# Patient Record
Sex: Female | Born: 1985
Health system: Southern US, Community
[De-identification: ages and names within clinical notes are randomized; demographics above are authoritative.]

## PROBLEM LIST (undated history)

## (undated) DIAGNOSIS — O24419 Gestational diabetes mellitus in pregnancy, unspecified control: Secondary | ICD-10-CM

## (undated) DIAGNOSIS — F419 Anxiety disorder, unspecified: Secondary | ICD-10-CM

## (undated) DIAGNOSIS — D689 Coagulation defect, unspecified: Secondary | ICD-10-CM

## (undated) DIAGNOSIS — R079 Chest pain, unspecified: Secondary | ICD-10-CM

## (undated) DIAGNOSIS — G08 Intracranial and intraspinal phlebitis and thrombophlebitis: Secondary | ICD-10-CM

## (undated) HISTORY — DX: Gestational diabetes mellitus in pregnancy, unspecified control: O24.419

## (undated) HISTORY — PX: NO PAST SURGERIES: SHX2092

## (undated) HISTORY — DX: Coagulation defect, unspecified: D68.9

---

## 2016-06-02 ENCOUNTER — Emergency Department (HOSPITAL_COMMUNITY): Payer: BLUE CROSS/BLUE SHIELD

## 2016-06-02 ENCOUNTER — Encounter (HOSPITAL_COMMUNITY): Payer: Self-pay | Admitting: Emergency Medicine

## 2016-06-02 ENCOUNTER — Emergency Department (HOSPITAL_COMMUNITY)
Admission: EM | Admit: 2016-06-02 | Discharge: 2016-06-02 | Disposition: A | Discharge status: Home / Self Care | Payer: BLUE CROSS/BLUE SHIELD | Attending: Emergency Medicine | Admitting: Emergency Medicine

## 2016-06-02 DIAGNOSIS — Z79899 Other long term (current) drug therapy: Secondary | ICD-10-CM | POA: Diagnosis not present

## 2016-06-02 DIAGNOSIS — R079 Chest pain, unspecified: Secondary | ICD-10-CM | POA: Diagnosis present

## 2016-06-02 DIAGNOSIS — F419 Anxiety disorder, unspecified: Secondary | ICD-10-CM | POA: Diagnosis not present

## 2016-06-02 DIAGNOSIS — Z7982 Long term (current) use of aspirin: Secondary | ICD-10-CM | POA: Insufficient documentation

## 2016-06-02 DIAGNOSIS — K219 Gastro-esophageal reflux disease without esophagitis: Secondary | ICD-10-CM | POA: Diagnosis not present

## 2016-06-02 HISTORY — DX: Chest pain, unspecified: R07.9

## 2016-06-02 HISTORY — DX: Intracranial and intraspinal phlebitis and thrombophlebitis: G08

## 2016-06-02 HISTORY — DX: Anxiety disorder, unspecified: F41.9

## 2016-06-02 LAB — I-STAT CHEM 8, ED
BUN: 16 mg/dL (ref 6–20)
CHLORIDE: 108 mmol/L (ref 101–111)
Calcium, Ion: 1.13 mmol/L — ABNORMAL LOW (ref 1.15–1.40)
Creatinine, Ser: 0.6 mg/dL (ref 0.44–1.00)
Glucose, Bld: 96 mg/dL (ref 65–99)
HCT: 36 % (ref 36.0–46.0)
HEMOGLOBIN: 12.2 g/dL (ref 12.0–15.0)
POTASSIUM: 5.9 mmol/L — AB (ref 3.5–5.1)
SODIUM: 136 mmol/L (ref 135–145)
TCO2: 25 mmol/L (ref 0–100)

## 2016-06-02 LAB — I-STAT BETA HCG BLOOD, ED (MC, WL, AP ONLY)

## 2016-06-02 MED ORDER — HYDROXYZINE HCL 25 MG PO TABS: 25.0000 mg | ORAL_TABLET | Freq: Four times a day (QID) | ORAL | 0 refills | Status: DC | PRN

## 2016-06-02 MED ORDER — OMEPRAZOLE 20 MG PO CPDR: 20.0000 mg | DELAYED_RELEASE_CAPSULE | Freq: Every day | ORAL | 1 refills | Status: DC

## 2016-06-02 MED ORDER — GI COCKTAIL ~~LOC~~
30.0000 mL | Freq: Once | ORAL | Status: AC
  Administered 2016-06-02: 30 mL via ORAL
  Filled 2016-06-02: qty 30

## 2016-06-02 NOTE — ED Provider Notes (Signed)
[facility]-EMERGENCY DEPT Provider Note   CSN: [id number] Arrival date & time: 06/02/16  1204     History   Chief Complaint Chief Complaint  Patient presents with  . Chest Pain    HPI Kimberly Parks is a 31 y.o. female.  HPI Pt was shopping.  She bent over and when she stood up she had sudden onset of burning pain in her center of her chest.  She felt tingling in her body, most on the left side but also on the right.  She felt short of breath.   No pain anywhere else.  No fevers.  No coughing.  No history of heart or lung problems.  No birth control pills.  No history of PE or DVT.  No history of heart disease.  She took a couple of clonazepam.  Over the last couple of hours the sx have slowly improved but not completely resolved.  Pt has had similar sx before.  This is the 4th time.  She was told before it was a panic attack but this one felt different. Past Medical History:  Diagnosis Date  . Anxiety   . Cerebral venous thrombosis   . Chest pain     There are no active problems to display for this patient.   History reviewed. No pertinent surgical history.  OB History    No data available       Home Medications    Prior to Admission medications   Medication Sig Start Date End Date Taking? Authorizing Provider  aspirin EC 81 MG tablet Take 81 mg by mouth daily.   Yes [provider]  cetirizine (ZYRTEC) 10 MG tablet Take 10 mg by mouth daily.   Yes [provider]  clonazePAM (KLONOPIN) 0.5 MG tablet Take 0.5 mg by mouth daily as needed for anxiety. 04/19/16  Yes [provider]  levocetirizine (XYZAL) 5 MG tablet Take 5 mg by mouth every evening.   Yes [provider]  PARAGARD INTRAUTERINE COPPER IU 1 application by Intrauterine route once.   Yes [provider]  hydrOXYzine (ATARAX/VISTARIL) 25 MG tablet Take 1 tablet (25 mg total) by mouth every 6 (six) hours as needed for anxiety. 06/02/16   Linwood Dibbles, MD  omeprazole  (PRILOSEC) 20 MG capsule Take 1 capsule (20 mg total) by mouth daily. 06/02/16   Linwood Dibbles, MD    Family History Family History  Problem Relation Age of Onset  . Hypertension Mother   . Diabetes Father     Social History Social History  Substance Use Topics  . Smoking status: Never Smoker  . Smokeless tobacco: Never Used  . Alcohol use Yes     Comment: occasional     Allergies   Patient has no known allergies.   Review of Systems Review of Systems  All other systems reviewed and are negative.    Physical Exam Updated Vital Signs BP 120/65   Pulse 76   Temp 98 F (36.7 C) (Oral)   Resp 17   Ht 1.575 m (5\' 2" )   Wt 70.3 kg (155 lb)   LMP 05/28/2016 (Exact Date)   SpO2 98%   BMI 28.35 kg/m   Physical Exam  Constitutional: She appears well-developed and well-nourished. No distress.  HENT:  Head: Normocephalic and atraumatic.  Right Ear: External ear normal.  Left Ear: External ear normal.  Eyes: Conjunctivae are normal. Right eye exhibits no discharge. Left eye exhibits no discharge. No scleral icterus.  Neck: Neck supple. No  tracheal deviation present.  Cardiovascular: Normal rate, regular rhythm and intact distal pulses.   Pulmonary/Chest: Effort normal and breath sounds normal. No stridor. No respiratory distress. She has no wheezes. She has no rales.  Abdominal: Soft. Bowel sounds are normal. She exhibits no distension. There is no tenderness. There is no rebound and no guarding.  Musculoskeletal: She exhibits no edema or tenderness.  Neurological: She is alert. She has normal strength. No cranial nerve deficit (no facial droop, extraocular movements intact, no slurred speech) or sensory deficit. She exhibits normal muscle tone. She displays no seizure activity. Coordination normal.  Skin: Skin is warm and dry. No rash noted.  Psychiatric: She has a normal mood and affect.  Nursing note and vitals reviewed.    ED Treatments / Results  Labs (all labs  ordered are listed, but only abnormal results are displayed) Labs Reviewed  I-STAT CHEM 8, ED - Abnormal; Notable for the following:       Result Value   Potassium 5.9 (*)    Calcium, Ion 1.13 (*)    All other components within normal limits  I-STAT BETA HCG BLOOD, ED ([facility], [facility], [facility] ONLY)    EKG  EKG Interpretation  Date/Time:  Sunday Jun 02 2016 12:59:47 EDT Ventricular Rate:  78 PR Interval:    QRS Duration: 94 QT Interval:  379 QTC Calculation: 432 R Axis:   48 Text Interpretation:  Sinus rhythm Low voltage, precordial leads Baseline wander in lead(s) V4 No old tracing to compare Confirmed by Linwood DibblesKnapp, Eirik Schueler [phone]([id number]) on 06/02/2016 3:09:03 PM       Radiology Dg Chest 2 View  Result Date: 06/02/2016 CLINICAL DATA:  Chest pain. EXAM: CHEST  2 VIEW COMPARISON:  None. FINDINGS: Cardiomediastinal silhouette is normal in size and configuration. Lungs are clear. Lung volumes are normal. No evidence of pneumonia. No pleural effusion. No pneumothorax. Osseous and soft tissue structures about the chest are unremarkable. IMPRESSION: Normal chest x-ray. Electronically Signed   By: Bary RichardStan  Maynard M.D.   On: 06/02/2016 15:25    Procedures Procedures (including critical care time)  Medications Ordered in ED Medications  gi cocktail (Maalox,Lidocaine,Donnatal) (30 mLs Oral Given 06/02/16 1544)     Initial Impression / Assessment and Plan / ED Course  I have reviewed the triage vital signs and the nursing notes.  Pertinent labs & imaging results that were available during my care of the patient were reviewed by me and considered in my medical decision making (see chart for details).  Clinical Course as of May 28 0001  Sun Jun 02, 2016  [facility]  [JK]    Clinical Course User Index [JK] Linwood DibblesKnapp, Cassandre Oleksy, MD    Patient presented to the emergency room with complaints of chest pain and weakness. I suspect that her symptoms today were related to gastroesophageal reflux. Think that may have  triggered an anxiety attack. I doubt the patient has a pulmonary embolism. She is perk negative. An overall low risk for pulmonary embolism. I doubt that the symptoms were related to acute coronary syndrome. Plan on discharge home with antacids and discussed outpatient follow up regarding her anxiety issues Final Clinical Impressions(s) / ED Diagnoses   Final diagnoses:  Gastroesophageal reflux disease, esophagitis presence not specified  Chest pain, unspecified type  Anxiety    New Prescriptions Discharge Medication List as of 06/02/2016  5:13 PM    START taking these medications   Details  hydrOXYzine (ATARAX/VISTARIL) 25 MG tablet Take 1 tablet (25 mg  total) by mouth every 6 (six) hours as needed for anxiety., Starting Sun 06/02/2016, Print    omeprazole (PRILOSEC) 20 MG capsule Take 1 capsule (20 mg total) by mouth daily., Starting Sun 06/02/2016, Print         Linwood Dibbles, MD 06/03/16 Marlyne Beards

## 2016-06-02 NOTE — ED Triage Notes (Signed)
Pt c/o chest pain and burning to mid chest at 10:30 this morning. Pt states she was bending over and developed pain, pt states she had episode of feeling light headed. States she has had intermittent symptoms similar to this r/t anxiety, on meds for anxiety. Pt in no distress in triage.

## 2016-06-02 NOTE — Discharge Instructions (Signed)
Follow-up with a primary care doctor to discuss further treatment, you can take the hydroxyzine as needed for any anxiety attack, try taking the Prilosec to see if that helps with the symptoms   Outpatient Psychiatry and Counseling  [facility]: Mobile Crisis Management 24 hours:  [phone]  [facility] [employer] sliding scale fee and walk in schedule: M-F 8am-12pm/1pm-3pm [address]  [place], [place] [address] [phone]  [facility] [address] [place], [place] [address] [phone]  [facility] [employer] new patient walk-in appointments available Monday - Friday 8am -3pm.          [address] [place], [place] [address] [phone] or crisis line- [phone]  [facility] Outpatient Services/ Intensive Outpatient Therapy Program [address] [place], [place] [address] [phone]  [facility] Mental Health                  Crisis Services      [phone] [address]     [place], [place] [address]                 [facility]   [facility] [phone]. [address] [place], [place] [address]   [employer] of Care          [address]  [place], [place] [address]       [phone]  [facility] [address] [place], [place] [address] [phone]  [facility]    [address]    [place], [place] [address]     [phone]       Andee Poles, MD     [address]     [place] [address]     [phone]       [facility] [address] [place] [address]  [facility]     [address] [place], [place]      [phone]       [facility] Eulogio Ditch, MD [address] [place], [place] [address] [phone]  [facility]     [address]     [place], [place] [address]     [phone]       [facility] [address] [place], [place] [address] [phone] Resources for Temporary Residential Assistance/Crisis Centers  DAY CENT[facility]) M-F 8am-3pm   [address] [place], [place] [address]   [phone] Services include: laundry, barbering, support groups, case management, phone  & computer access, showers, AA/NA mtgs, mental health/substance abuse nurse, job skills class, disability information, VA assistance, spiritual classes, etc.   HOMELESS SHELTERS  [facility]     [facility]   [address] [place]     [phone]              [employer] (women and children)       [address] [place], [place] [address] [phone] [email or web address] for application and process Application Required  [facility]   [address]    [place] [address]     [phone]                    [facility]  [facility] [address] [place], [place] [address] [phone](schedule application appt.) Application Required  [facility] (women only)    [address]     [place], [place] [address]     [phone]      Intake starts 6pm daily Need valid ID, SSC, & Police report [facility] [place] [address] [place] [address] [place], [place] [phone] Application Required  [facility] (men only)     [address]      [place], [place]     [phone]       [facility] (Pregnant women only) [address] [place], [place] [phone]  [facility]      [address]      [place], [place] [address]     [phone]             [facility] [address] [place], [place] [phone] 90 day commitment/SA/Application process  [facility](men only)     [address]     [place], [place]     [phone]       Check-in at [facility]            [facility] [address] [place], [place] [address] [phone] Men/Women/Women and Children must be there by 7 pm  [facility] [place], [place] [phone]                lps with the gastroesophageal reflux type symptoms

## 2017-02-27 DIAGNOSIS — Z01419 Encounter for gynecological examination (general) (routine) without abnormal findings: Secondary | ICD-10-CM | POA: Diagnosis not present

## 2017-04-07 ENCOUNTER — Other Ambulatory Visit: Payer: Self-pay

## 2017-04-07 ENCOUNTER — Emergency Department (HOSPITAL_COMMUNITY)
Admission: EM | Admit: 2017-04-07 | Discharge: 2017-04-07 | Disposition: A | Discharge status: Home / Self Care | Payer: BLUE CROSS/BLUE SHIELD | Attending: Emergency Medicine | Admitting: Emergency Medicine

## 2017-04-07 ENCOUNTER — Encounter (HOSPITAL_COMMUNITY): Payer: Self-pay

## 2017-04-07 DIAGNOSIS — R0981 Nasal congestion: Secondary | ICD-10-CM | POA: Insufficient documentation

## 2017-04-07 DIAGNOSIS — R52 Pain, unspecified: Secondary | ICD-10-CM | POA: Insufficient documentation

## 2017-04-07 DIAGNOSIS — Z5321 Procedure and treatment not carried out due to patient leaving prior to being seen by health care provider: Secondary | ICD-10-CM | POA: Diagnosis not present

## 2017-04-07 DIAGNOSIS — R05 Cough: Secondary | ICD-10-CM | POA: Insufficient documentation

## 2017-04-07 LAB — CBG MONITORING, ED: Glucose-Capillary: 131 mg/dL — ABNORMAL HIGH (ref 65–99)

## 2017-04-07 NOTE — ED Triage Notes (Signed)
Patient c/o body aches, weakness today. Cough and  nasal congestion x 3 days.

## 2017-04-09 NOTE — ED Notes (Signed)
04/09/2017 Follow-up call completed.  Pt. Did not have any concerns.

## 2017-04-30 DIAGNOSIS — J302 Other seasonal allergic rhinitis: Secondary | ICD-10-CM | POA: Diagnosis not present

## 2017-04-30 DIAGNOSIS — J9801 Acute bronchospasm: Secondary | ICD-10-CM | POA: Diagnosis not present

## 2017-05-06 ENCOUNTER — Emergency Department (HOSPITAL_COMMUNITY)
Admission: EM | Admit: 2017-05-06 | Discharge: 2017-05-07 | Disposition: A | Discharge status: Home / Self Care | Payer: 59 | Attending: Emergency Medicine | Admitting: Emergency Medicine

## 2017-05-06 ENCOUNTER — Encounter (HOSPITAL_COMMUNITY): Payer: Self-pay

## 2017-05-06 DIAGNOSIS — R200 Anesthesia of skin: Secondary | ICD-10-CM | POA: Diagnosis not present

## 2017-05-06 DIAGNOSIS — D72829 Elevated white blood cell count, unspecified: Secondary | ICD-10-CM | POA: Insufficient documentation

## 2017-05-06 DIAGNOSIS — Z3202 Encounter for pregnancy test, result negative: Secondary | ICD-10-CM | POA: Insufficient documentation

## 2017-05-06 DIAGNOSIS — Z7982 Long term (current) use of aspirin: Secondary | ICD-10-CM | POA: Diagnosis not present

## 2017-05-06 DIAGNOSIS — Z79899 Other long term (current) drug therapy: Secondary | ICD-10-CM | POA: Diagnosis not present

## 2017-05-06 DIAGNOSIS — R51 Headache: Secondary | ICD-10-CM | POA: Insufficient documentation

## 2017-05-06 DIAGNOSIS — R519 Headache, unspecified: Secondary | ICD-10-CM

## 2017-05-06 LAB — CBC WITH DIFFERENTIAL/PLATELET
BASOS ABS: 0 10*3/uL (ref 0.0–0.1)
Basophils Relative: 0 %
EOS PCT: 0 %
Eosinophils Absolute: 0 10*3/uL (ref 0.0–0.7)
HCT: 37 % (ref 36.0–46.0)
Hemoglobin: 12.3 g/dL (ref 12.0–15.0)
LYMPHS PCT: 18 %
Lymphs Abs: 2.9 10*3/uL (ref 0.7–4.0)
MCH: 26.7 pg (ref 26.0–34.0)
MCHC: 33.2 g/dL (ref 30.0–36.0)
MCV: 80.3 fL (ref 78.0–100.0)
MONO ABS: 0.9 10*3/uL (ref 0.1–1.0)
Monocytes Relative: 6 %
Neutro Abs: 12.5 10*3/uL — ABNORMAL HIGH (ref 1.7–7.7)
Neutrophils Relative %: 76 %
PLATELETS: 351 10*3/uL (ref 150–400)
RBC: 4.61 MIL/uL (ref 3.87–5.11)
RDW: 14.7 % (ref 11.5–15.5)
WBC: 16.4 10*3/uL — AB (ref 4.0–10.5)

## 2017-05-06 LAB — BASIC METABOLIC PANEL
ANION GAP: 10 (ref 5–15)
BUN: 17 mg/dL (ref 6–20)
CALCIUM: 9.6 mg/dL (ref 8.9–10.3)
CO2: 23 mmol/L (ref 22–32)
Chloride: 106 mmol/L (ref 101–111)
Creatinine, Ser: 0.68 mg/dL (ref 0.44–1.00)
GFR calc Af Amer: >60 mL/min (ref >60)
GLUCOSE: 112 mg/dL — AB (ref 65–99)
Potassium: 3.9 mmol/L (ref 3.5–5.1)
SODIUM: 139 mmol/L (ref 135–145)

## 2017-05-06 LAB — I-STAT BETA HCG BLOOD, ED (MC, WL, AP ONLY)

## 2017-05-06 MED ORDER — DIPHENHYDRAMINE HCL 50 MG/ML IJ SOLN
25.0000 mg | Freq: Once | INTRAMUSCULAR | Status: AC
  Administered 2017-05-06: 25 mg via INTRAVENOUS
  Filled 2017-05-06: qty 1

## 2017-05-06 MED ORDER — PROCHLORPERAZINE EDISYLATE 10 MG/2ML IJ SOLN
10.0000 mg | Freq: Once | INTRAMUSCULAR | Status: AC
  Administered 2017-05-06: 10 mg via INTRAVENOUS
  Filled 2017-05-06: qty 2

## 2017-05-06 MED ORDER — SODIUM CHLORIDE 0.9 % IV BOLUS
1000.0000 mL | Freq: Once | INTRAVENOUS | Status: AC
  Administered 2017-05-06: 1000 mL via INTRAVENOUS

## 2017-05-06 MED ORDER — IOPAMIDOL (ISOVUE-370) INJECTION 76%
INTRAVENOUS | Status: AC
  Filled 2017-05-06: qty 100

## 2017-05-06 NOTE — ED Provider Notes (Signed)
[facility]-EMERGENCY DEPT Provider Note   CSN: [id number] Arrival date & time: 05/06/17  1921     History   Chief Complaint Chief Complaint  Patient presents with  . face numbness    HPI Kimberly Parks is a 32 y.o. female.  The history is provided by the patient, a significant other and medical records.  Headache   This is a new problem. The current episode started 2 days ago. The problem occurs constantly. The problem has not changed since onset.The headache is associated with bright light. The pain is located in the left unilateral and occipital region. The quality of the pain is described as dull. The pain is at a severity of 4/10. The pain is moderate. The pain radiates to the left neck. Pertinent negatives include no fever, no malaise/fatigue, no near-syncope, no palpitations, no shortness of breath, no nausea and no vomiting. She has tried nothing for the symptoms. The treatment provided no relief.    Past Medical History:  Diagnosis Date  . Anxiety   . Cerebral venous thrombosis   . Chest pain     There are no active problems to display for this patient.   History reviewed. No pertinent surgical history.   OB History   None      Home Medications    Prior to Admission medications   Medication Sig Start Date End Date Taking? Authorizing Provider  aspirin EC 81 MG tablet Take 81 mg by mouth daily.    [provider]  cetirizine (ZYRTEC) 10 MG tablet Take 10 mg by mouth daily.    [provider]  clonazePAM (KLONOPIN) 0.5 MG tablet Take 0.5 mg by mouth daily as needed for anxiety. 04/19/16   [provider]  hydrOXYzine (ATARAX/VISTARIL) 25 MG tablet Take 1 tablet (25 mg total) by mouth every 6 (six) hours as needed for anxiety. 06/02/16   Linwood Dibbles, MD  levocetirizine (XYZAL) 5 MG tablet Take 5 mg by mouth every evening.    [provider]  omeprazole (PRILOSEC) 20 MG capsule Take 1 capsule (20 mg total) by  mouth daily. 06/02/16   Linwood Dibbles, MD  PARAGARD INTRAUTERINE COPPER IU 1 application by Intrauterine route once.    [provider]    Family History Family History  Problem Relation Age of Onset  . Hypertension Mother   . Diabetes Father     Social History Social History   Tobacco Use  . Smoking status: Never Smoker  . Smokeless tobacco: Never Used  Substance Use Topics  . Alcohol use: Yes    Comment: occasional  . Drug use: No     Allergies   Patient has no known allergies.   Review of Systems Review of Systems  Constitutional: Negative for chills, diaphoresis, fatigue, fever and malaise/fatigue.  HENT: Negative for congestion.   Eyes: Negative for visual disturbance.  Respiratory: Negative for cough, chest tightness, shortness of breath and wheezing.   Cardiovascular: Negative for chest pain, palpitations and near-syncope.  Gastrointestinal: Negative for abdominal pain, constipation, diarrhea, nausea and vomiting.  Genitourinary: Negative for dysuria, flank pain and frequency.  Musculoskeletal: Positive for neck pain. Negative for back pain and neck stiffness.  Skin: Negative for rash and wound.  Neurological: Positive for weakness, numbness and headaches. Negative for seizures, speech difficulty and light-headedness.  Psychiatric/Behavioral: Negative for agitation and confusion.  All other systems reviewed and are negative.    Physical Exam Updated Vital Signs BP 131/72 (BP Location: Left Arm)  Pulse 72   Temp 97.9 F (36.6 C) (Oral)   Resp 14   Ht  (1.549 m)   Wt 76.2 kg (168 lb)   LMP 05/06/2017   SpO2 98%   BMI 31.74 kg/m   Physical Exam  Constitutional: She is oriented to person, place, and time. She appears well-developed and well-nourished. No distress.  HENT:  Head: Normocephalic and atraumatic.  Mouth/Throat: Oropharynx is clear and moist.  Eyes: Pupils are equal, round, and reactive to light. Conjunctivae and EOM are normal.    Neck: Normal range of motion. Neck supple.  Cardiovascular: Normal rate and intact distal pulses.  No murmur heard. Pulmonary/Chest: Effort normal and breath sounds normal. No respiratory distress. She has no wheezes. She exhibits no tenderness.  Abdominal: Soft. Bowel sounds are normal. She exhibits no distension. There is no tenderness.  Musculoskeletal: She exhibits no edema or tenderness.  Lymphadenopathy:    She has no cervical adenopathy.  Neurological: She is alert and oriented to person, place, and time. No cranial nerve deficit or sensory deficit. She exhibits normal muscle tone. Coordination normal.  No numbness, tingling, or weakness on my exam.  Normal extra ocular movements.  No facial droop.  Skin: Skin is warm. Capillary refill takes less than 2 seconds. No rash noted. She is not diaphoretic. No erythema.  Psychiatric: She has a normal mood and affect.  Nursing note and vitals reviewed.    ED Treatments / Results  Labs (all labs ordered are listed, but only abnormal results are displayed) Labs Reviewed  CBC WITH DIFFERENTIAL/PLATELET - Abnormal; Notable for the following components:      Result Value   WBC 16.4 (*)    Neutro Abs 12.5 (*)    All other components within normal limits  BASIC METABOLIC PANEL - Abnormal; Notable for the following components:   Glucose, Bld 112 (*)    All other components within normal limits  I-STAT BETA HCG BLOOD, ED ([facility], [facility], [facility] ONLY)    EKG None  Radiology No results found.  Procedures Procedures (including critical care time)  Medications Ordered in ED Medications  iopamidol (ISOVUE-370) 76 % injection (has no administration in time range)  sodium chloride 0.9 % bolus 1,000 mL (1,000 mLs Intravenous New Bag/Given 05/06/17 2258)  prochlorperazine (COMPAZINE) injection 10 mg (10 mg Intravenous Given 05/06/17 2259)  diphenhydrAMINE (BENADRYL) injection 25 mg (25 mg Intravenous Given 05/06/17 2258)     Initial Impression /  Assessment and Plan / ED Course  I have reviewed the triage vital signs and the nursing notes.  Pertinent labs & imaging results that were available during my care of the patient were reviewed by me and considered in my medical decision making (see chart for details).     C[place] Paiz is a 32 y.o. female with a past medical history significant for central venous thrombosis and PTSD who presents with headache and numbness.  Patient reports that 2 days ago, patient woke up with headaches.  She reports his headache in the left side of her head including the left lateral neck.  She reports that the headache feels different than her prior central venous thrombosis however she is very concerned that it might be an intracranial problem.  She says that she developed left face numbness and left arm numbness.  She reports intermittent left hand weakness that has resolved.  She reports associated photophobia and phonophobia and the numbness worsens when the headache worsens.  She reports her pain is currently  4-10 severity but gets up to 6 out of 10 at times.  She denies any vision changes.  She is right-handed.  She denies any fevers, chills, chest pain, shortness of breath, nausea, vomiting, conservation, diarrhea, dysuria.  She reports the pain radiates into the left lateral neck but does not have any nuchal rigidity.  She reports that her neurologic deficits have resolved on my initial evaluation.    On exam, no focal neurologic deficits were seen.  Normal sensation, strength, and coordination in upper extremities.  No facial droop and normal sensation of the face.  Extraocular movements were intact and pupils are reactive and symmetric bilaterally.  Left lateral neck was tender to palpation but no tenderness of the head.  No bruits appreciated.  Lungs clear and chest nontender.  Exam otherwise unremarkable.  Next  Patient will have CTA of the head and neck and CTV of the head to look for pathology.  Patient  will be given a headache cocktail as I suspect the patient's symptoms are due to comp gated migraine.  Given the patient's history, will need to rule out venous thrombosis or vascular abnormality.  If imaging is reassuring and patient is feeling better suspect she will be safe for discharge home with outpatient neurology follow-up.     Prior to imaging, laboratory testing was performed showing negative pregnancy test.  BMP reassuring.  CBC shows mild leukocytosis which is likely due to demargination as I have a low suspicion for infection with the lack of fevers or chills.  Doubt meningitis at this time.  Anticipate reassessment after medications and imaging.  After medications, patient reports her headache has significantly improved.  No further numbness or tingling.  If imaging is negative, patient will likely be stable for discharge home and outpatient neurology follow-up.  Anticipate discharge.  Care transferred to Dr. Lynelle Doctor while awaiting CT results.  Care transferred in stable condition.  Final Clinical Impressions(s) / ED Diagnoses   Final diagnoses:  Acute nonintractable headache, unspecified headache type     Clinical Impression: 1. Acute nonintractable headache, unspecified headache type     Disposition: Care transferred to Dr. Honor Junes, Canary Brim, MD 05/07/17 [phone]

## 2017-05-06 NOTE — ED Triage Notes (Signed)
Pt has just finished her 7th day of prednisone that she was taking for seasonal allergies

## 2017-05-06 NOTE — ED Triage Notes (Signed)
Pt complains of pressure behind her left eye and her cheek, she also states that her left arm feels numb and tingly  Pt states that she has a sharp pain at the base of her head

## 2017-05-07 ENCOUNTER — Emergency Department (HOSPITAL_COMMUNITY): Payer: 59

## 2017-05-07 ENCOUNTER — Encounter (HOSPITAL_COMMUNITY): Payer: Self-pay

## 2017-05-07 DIAGNOSIS — R200 Anesthesia of skin: Secondary | ICD-10-CM | POA: Diagnosis not present

## 2017-05-07 MED ORDER — IOPAMIDOL (ISOVUE-370) INJECTION 76%
100.0000 mL | Freq: Once | INTRAVENOUS | Status: AC | PRN
  Administered 2017-05-07: 100 mL via INTRAVENOUS

## 2017-05-07 NOTE — Discharge Instructions (Signed)
Your test tonight look good, the cerebral venous thrombosis you had before is not present tonight.  Your symptoms sound like you had a complex migraine with the numbness.  You can be evaluated by a neurologist and I gave you 2 different groups you can follow-up with if you do not have a neurologist.  Return to the emergency department if you get worse.

## 2017-05-07 NOTE — ED Provider Notes (Signed)
Patient left a change of shift to get the results of her CT scans.  We discussed her CT results and patient was very relieved.  She states her headache is much better and she now does have some discomfort in the back of her neck.  She denies persistence of the numbness that she had earlier.  It would appear that she had a complex migraine with some numbness that has resolved. She feels ready to be discharged home.  She did not want any other medication.    Ct Angio Head W Or Wo Contrast Ct Angio Neck W And/or Wo Contrast Ct Venogram Head  Result Date: 05/07/2017 CLINICAL DATA:  Pain behind left eye. Left arm numbness. History of cerebral venous thrombosis. EXAM: CT ANGIOGRAPHY HEAD AND NECK CT VENOGRAPHY HEAD TECHNIQUE: Multidetector CT imaging of the head and neck was performed using the standard protocol during bolus administration of intravenous contrast. Multiplanar CT image reconstructions and MIPs were obtained to evaluate the vascular anatomy. Carotid stenosis measurements (when applicable) are obtained utilizing NASCET criteria, using the distal internal carotid diameter as the denominator. Vena graphic postcontrast images of the head were also obtained with multiplanar MIPS. CONTRAST:  ISOVUE-370 IOPAMIDOL (ISOVUE-370) INJECTION 76% COMPARISON:  None. FINDINGS: CT HEAD FINDINGS BRAIN: No mass lesion, intraparenchymal hemorrhage or extra-axial collection. No evidence of acute cortical infarct. Normal appearance of the brain parenchyma and extra axial spaces for age. VASCULAR: No hyperdense vessel or unexpected vascular calcification. SKULL: Normal visualized skull base, calvarium and extracranial soft tissues. SINUSES/ORBITS: No sinus fluid levels or advanced mucosal thickening. No mastoid effusion. Normal orbits. CTA NECK FINDINGS AORTIC ARCH: There is no calcific atherosclerosis of the aortic arch. There is no aneurysm, dissection or hemodynamically significant stenosis of the visualized  ascending aorta and aortic arch. Conventional 3 vessel aortic branching pattern. The visualized proximal subclavian arteries are widely patent. RIGHT CAROTID SYSTEM: --Common carotid artery: Widely patent origin without common carotid artery dissection or aneurysm. --Internal carotid artery: No dissection, occlusion or aneurysm. No hemodynamically significant stenosis. --External carotid artery: No acute abnormality. LEFT CAROTID SYSTEM: --Common carotid artery: Widely patent origin without common carotid artery dissection or aneurysm. --Internal carotid artery:No dissection, occlusion or aneurysm. No hemodynamically significant stenosis. --External carotid artery: No acute abnormality. VERTEBRAL ARTERIES: Codominant configuration. Both origins are normal. No dissection, occlusion or flow-limiting stenosis to the vertebrobasilar confluence. SKELETON: There is no bony spinal canal stenosis. No lytic or blastic lesion. OTHER NECK: Normal pharynx, larynx and major salivary glands. No cervical lymphadenopathy. Unremarkable thyroid gland. UPPER CHEST: No pneumothorax or pleural effusion. No nodules or masses. CTA HEAD FINDINGS ANTERIOR CIRCULATION: --Intracranial internal carotid arteries: Normal. --Anterior cerebral arteries: Normal. Both A1 segments are present. Patent anterior communicating artery. --Middle cerebral arteries: Normal. --Posterior communicating arteries: Present on the left, absent on the right. POSTERIOR CIRCULATION: --Basilar artery: Normal. --Posterior cerebral arteries: Normal. --Superior cerebellar arteries: Normal. --Inferior cerebellar arteries: Normal anterior and posterior inferior cerebellar arteries. VENOUS SINUSES: As permitted by contrast timing, patent. ANATOMIC VARIANTS: None DELAYED PHASE: No parenchymal contrast enhancement. Review of the MIP images confirms the above findings. CTV HEAD FINDINGS Superior sagittal sinus: Normal. Straight sinus: Normal. Inferior sagittal sinus, vein of  Galen and internal cerebral veins: Normal. Transverse sinuses: Normal. Normal variant diminutive left transverse sinus. Sigmoid sinuses: Normal. Visualized jugular veins: Normal. IMPRESSION: 1. Normal head CT. 2. Normal CTA of the head and neck. 3. Normal CTV of the head. Electronically Signed   By: Chrisandra Netters.D.  On: 05/07/2017 01:20   Diagnoses that have been ruled out:  None  Diagnoses that are still under consideration:  None  Final diagnoses:  Acute nonintractable headache, unspecified headache type    Plan discharge  Devoria Albe, MD, Concha Pyo, MD 05/07/17 [phone]

## 2017-08-05 DIAGNOSIS — Z0000 Encounter for general adult medical examination without abnormal findings: Secondary | ICD-10-CM | POA: Diagnosis not present

## 2017-08-05 DIAGNOSIS — Z13220 Encounter for screening for lipoid disorders: Secondary | ICD-10-CM | POA: Diagnosis not present

## 2017-09-17 DIAGNOSIS — R300 Dysuria: Secondary | ICD-10-CM | POA: Diagnosis not present

## 2017-09-17 DIAGNOSIS — R319 Hematuria, unspecified: Secondary | ICD-10-CM | POA: Diagnosis not present

## 2017-09-17 DIAGNOSIS — N390 Urinary tract infection, site not specified: Secondary | ICD-10-CM | POA: Diagnosis not present

## 2017-11-20 DIAGNOSIS — N10 Acute tubulo-interstitial nephritis: Secondary | ICD-10-CM | POA: Diagnosis not present

## 2018-02-18 DIAGNOSIS — J029 Acute pharyngitis, unspecified: Secondary | ICD-10-CM | POA: Diagnosis not present

## 2018-02-18 DIAGNOSIS — J0300 Acute streptococcal tonsillitis, unspecified: Secondary | ICD-10-CM | POA: Diagnosis not present

## 2018-03-30 DIAGNOSIS — Z01419 Encounter for gynecological examination (general) (routine) without abnormal findings: Secondary | ICD-10-CM | POA: Diagnosis not present

## 2018-05-03 ENCOUNTER — Emergency Department (HOSPITAL_COMMUNITY)
Admission: EM | Admit: 2018-05-03 | Discharge: 2018-05-03 | Disposition: A | Discharge status: Home / Self Care | Payer: 59 | Attending: Emergency Medicine | Admitting: Emergency Medicine

## 2018-05-03 ENCOUNTER — Other Ambulatory Visit: Payer: Self-pay

## 2018-05-03 ENCOUNTER — Encounter (HOSPITAL_COMMUNITY): Payer: Self-pay | Admitting: *Deleted

## 2018-05-03 ENCOUNTER — Emergency Department (HOSPITAL_COMMUNITY): Payer: 59

## 2018-05-03 DIAGNOSIS — R0789 Other chest pain: Secondary | ICD-10-CM | POA: Diagnosis not present

## 2018-05-03 DIAGNOSIS — Z7982 Long term (current) use of aspirin: Secondary | ICD-10-CM | POA: Insufficient documentation

## 2018-05-03 DIAGNOSIS — R079 Chest pain, unspecified: Secondary | ICD-10-CM | POA: Diagnosis present

## 2018-05-03 DIAGNOSIS — Z79899 Other long term (current) drug therapy: Secondary | ICD-10-CM | POA: Insufficient documentation

## 2018-05-03 LAB — BASIC METABOLIC PANEL
Anion gap: 8 (ref 5–15)
BUN: 9 mg/dL (ref 6–20)
CO2: 24 mmol/L (ref 22–32)
Calcium: 9.1 mg/dL (ref 8.9–10.3)
Chloride: 102 mmol/L (ref 98–111)
Creatinine, Ser: 0.62 mg/dL (ref 0.44–1.00)
GFR calc Af Amer: >60 mL/min (ref >60)
GFR calc non Af Amer: >60 mL/min (ref >60)
Glucose, Bld: 96 mg/dL (ref 70–99)
Potassium: 3.5 mmol/L (ref 3.5–5.1)
Sodium: 134 mmol/L — ABNORMAL LOW (ref 135–145)

## 2018-05-03 LAB — CBC
HCT: 36.8 % (ref 36.0–46.0)
Hemoglobin: 11.9 g/dL — ABNORMAL LOW (ref 12.0–15.0)
MCH: 26.9 pg (ref 26.0–34.0)
MCHC: 32.3 g/dL (ref 30.0–36.0)
MCV: 83.1 fL (ref 80.0–100.0)
Platelets: 289 10*3/uL (ref 150–400)
RBC: 4.43 MIL/uL (ref 3.87–5.11)
RDW: 14.6 % (ref 11.5–15.5)
WBC: 10 10*3/uL (ref 4.0–10.5)
nRBC: 0 % (ref 0.0–0.2)

## 2018-05-03 LAB — TROPONIN I: Troponin I: <0.03 ng/mL (ref <0.03)

## 2018-05-03 LAB — D-DIMER, QUANTITATIVE: D-Dimer, Quant: 0.3 ug/mL-FEU (ref 0.00–0.50)

## 2018-05-03 LAB — I-STAT BETA HCG BLOOD, ED (MC, WL, AP ONLY): I-stat hCG, quantitative: <5 m[IU]/mL (ref <5)

## 2018-05-03 NOTE — Discharge Instructions (Addendum)
You have been seen today for chest pain. Please read and follow all provided instructions.  ° °1. Medications: usual home medications °2. Treatment: rest, drink plenty of fluids °3. Follow Up: Please follow up with your primary doctor in 2 days for discussion of your diagnoses and further evaluation after today's visit; if you do not have a primary care doctor use the resource guide provided to find one; Please return to the ER for any new or worsening symptoms. Please obtain all of your results from medical records or have your doctors office obtain the results - share them with your doctor - you should be seen at your doctors office. Call today to arrange your follow up.  ° °Take medications as prescribed. Please review all of the medicines and only take them if you do not have an allergy to them. Return to the emergency room for worsening condition or new concerning symptoms. Follow up with your regular doctor. If you don't have a regular doctor use one of the numbers below to establish a primary care doctor. ° °Please be aware that if you are taking birth control pills, taking other prescriptions, ESPECIALLY ANTIBIOTICS may make the birth control ineffective - if this is the case, either do not engage in sexual activity or use alternative methods of birth control such as condoms until you have finished the medicine and your family doctor says it is OK to restart them. If you are on a blood thinner such as COUMADIN, be aware that any other medicine that you take may cause the coumadin to either work too much, or not enough - you should have your coumadin level rechecked in next 7 days if this is the case.  °?  °It is also a possibility that you have an allergic reaction to any of the medicines that you have been prescribed - Everybody reacts differently to medications and while MOST people have no trouble with most medicines, you may have a reaction such as nausea, vomiting, rash, swelling, shortness of breath.  If this is the case, please stop taking the medicine immediately and contact your physician.  °?  °You should return to the ER if you develop severe or worsening symptoms.  ° °Emergency Department Resource Guide °1) Find a Doctor and Pay Out of Pocket °Although you won't have to find out who is covered by your insurance plan, it is a good idea to ask around and get recommendations. You will then need to call the office and see if the doctor you have chosen will accept you as a new patient and what types of options they offer for patients who are self-pay. Some doctors offer discounts or will set up payment plans for their patients who do not have insurance, but you will need to ask so you aren't surprised when you get to your appointment. ° °2) Contact Your Local Health Department °Not all health departments have doctors that can see patients for sick visits, but many do, so it is worth a call to see if yours does. If you don't know where your local health department is, you can check in your phone book. The CDC also has a tool to help you locate your state's health department, and many state websites also have listings of all of their local health departments. ° °3) Find a Walk-in Clinic °If your illness is not likely to be very severe or complicated, you may want to try a walk in clinic. These are popping up all over   the country in pharmacies, drugstores, and shopping centers. They're usually staffed by nurse practitioners or physician assistants that have been trained to treat common illnesses and complaints. They're usually fairly quick and inexpensive. However, if you have serious medical issues or chronic medical problems, these are probably not your best option. ° °No Primary Care Doctor: °Call [facility] at  832-8000 - they can help you locate a primary care doctor that  accepts your insurance, provides certain services, etc. °Physician Referral Service- 1-800-533-3463 ° °Chronic Pain  Problems: °Organization         Address  Phone   Notes  °[facility]  ([phone]) [phone] Patients need to be referred by their primary care doctor.  ° °Medication Assistance: °Organization         Address  Phone   Notes  °[place] [facility] [address] °[place], [place] [address] [phone]) [phone] --Must be a resident of [place] °-- Must have NO insurance coverage whatsoever (no Medicaid/ Medicare, etc.) °-- The pt. MUST have a primary care doctor that directs their care regularly and follows them in the community °  °[facility]  (866) 331-1348   °[employer]  (888) 892-1162   ° °Agencies that provide inexpensive medical care: °Organization         Address  Phone   Notes  °[facility]  ([phone]) [phone]   °[facility]    ([phone]) [phone]   °[facility] [address] °[place], [place] [address] (336) 832-4777   °[facility] [address], °[place] (336) [phone]   °[facility]    (336) 373-0678   °[facility]    (336) 272-1050   °[facility] ° [address], [place] Phone:  (336) [phone], Fax:  ([phone]) [phone] Hours of Operation:  9 am - 6 pm, M-F.  Also accepts Medicaid/Medicare and self-pay.  °[facility] ° [address], [place] Phone: (336) [phone], Fax: ([phone]) [phone]. Hours of Operation:  8:30 am - 5:30 pm, M-F.  Also accepts Medicaid and self-pay.  °[facility] [place] [address], [place] Phone: ([phone]) [phone]   °[facility] [address], [place], [place] (336)723-1848, Ext. 123 Mondays & Thursdays: 7-9 AM.  First 15 patients are seen on a first come, first serve basis. °  ° °Medicaid-accepting [place] Providers: ° °Organization         Address  Phone   Notes  °[facility] [address], [place] ([phone]) [phone] Also  accepts self-pay patients.  °[facility] [address], [place] ° (336) [phone]   °[facility] [address], [place] ([phone]) [phone]   °[facility] [address] [place] [address], [place] (336) [phone]   °[facility] [address], [place]  ° ([phone]) [phone] Only accepts [place] Access Medicaid patients after they have their name applied to their card.  ° °Self-Pay (no insurance) in [place]: ° °Organization         Address  Phone   Notes  °Sickle Cell Patients, [facility] [address], [place] ([phone]) [phone]   °[facility] [address], [place] ([phone]) [phone]   °[facility] ° [address], [place] ([phone]) [phone]   °[facility]/Dr. Osei-Bonsu ° [address] [place] [address], [place] or [address], [place] [phone]) [phone] Phone number for both [place] and [place] locations is the same.  °[facility] [address], [place] ([phone]) [phone]   °[facility] [address] [place] [address], [place] or [address] ([phone]) [phone] °(336) [phone]   °[facility] [address], [place] (336) [phone], phone; ([phone]) [phone], fax Sees patients 1st and 3rd Saturday of every month.  Must not qualify for public or private insurance (i.e. Medicaid, Medicare, [place] Health Choice, Veterans' Benefits)  Household income should be no more than 200% of the poverty level The clinic cannot treat you if you are pregnant or think you are pregnant  Sexually transmitted diseases are not treated at the clinic.  °  ° °

## 2018-05-03 NOTE — ED Triage Notes (Signed)
Pt complains of tightness in chest, tingling in face, fatigue for the past 2 weeks. Pt states she had an anxiety attack while driving home 3 times in the past week. Pt had e-visit last week and was given prescription for clonazepam, which she took this morning.

## 2018-05-03 NOTE — ED Provider Notes (Signed)
[facility]-EMERGENCY DEPT Provider Note   CSN: [id number] Arrival date & time: 05/03/18  1216    History   Chief Complaint Chief Complaint  Patient presents with  . Chest Pain  . Tingling  . Anxiety    HPI Kimberly Parks is a 33 y.o. female with a PMH of anxiety, chest pain, and cerebral venous thrombosis presenting with intermittent left sided chest pain onset 2 weeks ago. Patient describes pain as tightness and states anxiety makes her pain worse. Patient reports she was prescribed Clonazepam by her PCP with relief. Patient states she took Clonazepam prior to arrival. Patient states she has had four panic attacks this week. Patient reports she is stressed due to work, concern for COVID-19, and her mother's health. Patient reports tremors, palpitations, and whole body paresthesias with each episode. Patient states she last had an episode this morning and it lasted 1-2 hours. Patient denies a cardiac history. Patient reports a family history of heart disease in her paternal grandfather in his 2750s. Patient denies alcohol, tobacco, or drug use. Patient denies shortness of breath, fever, cough, congestion, abdominal pain, nausea, vomiting, sick contacts, or recent travel. Patient denies using recent surgery, leg edema/pain, or hx of DVT/PE. Patient denies headaches, vision changes, or weakness. Patient denies any symptoms currently.       HPI  Past Medical History:  Diagnosis Date  . Anxiety   . Cerebral venous thrombosis   . Chest pain     There are no active problems to display for this patient.   History reviewed. No pertinent surgical history.   OB History   No obstetric history on file.      Home Medications    Prior to Admission medications   Medication Sig Start Date End Date Taking? Authorizing Provider  aspirin EC 81 MG tablet Take 81 mg by mouth daily.   Yes [provider]  cetirizine (ZYRTEC) 10 MG tablet Take 10 mg by mouth daily  as needed for allergies.    Yes [provider]  clonazePAM (KLONOPIN) 0.5 MG tablet Take 0.5 mg by mouth daily as needed for anxiety. 04/19/16  Yes [provider]  sertraline (ZOLOFT) 100 MG tablet Take 50 mg by mouth daily.  04/14/17  Yes [provider]    Family History Family History  Problem Relation Age of Onset  . Hypertension Mother   . Diabetes Father     Social History Social History   Tobacco Use  . Smoking status: Never Smoker  . Smokeless tobacco: Never Used  Substance Use Topics  . Alcohol use: Yes    Comment: occasional  . Drug use: No     Allergies   Patient has no known allergies.   Review of Systems Review of Systems  Constitutional: Negative for activity change, appetite change, chills, diaphoresis, fatigue, fever and unexpected weight change.  HENT: Negative for congestion and rhinorrhea.   Eyes: Negative for visual disturbance.  Respiratory: Negative for cough, chest tightness, shortness of breath and wheezing.   Cardiovascular: Positive for chest pain and palpitations. Negative for leg swelling.  Gastrointestinal: Negative for abdominal pain, constipation, diarrhea, nausea and vomiting.  Endocrine: Negative for cold intolerance and heat intolerance.  Musculoskeletal: Negative for back pain.  Skin: Negative for rash.  Allergic/Immunologic: Negative for immunocompromised state.  Neurological: Positive for tremors. Negative for dizziness, syncope, speech difficulty, weakness, light-headedness, numbness and headaches.       Pt reports body paresthesias.   Psychiatric/Behavioral: Negative for  agitation and behavioral problems. The patient is nervous/anxious.     Physical Exam Updated Vital Signs BP 123/77   Pulse 71   Temp 98.3 F (36.8 C) (Oral)   Resp 14   LMP 04/05/2018   SpO2 97%   Physical Exam Vitals signs and nursing note reviewed.  Constitutional:      General: She is not in acute distress.    Appearance:  She is well-developed. She is not diaphoretic.  HENT:     Head: Normocephalic and atraumatic.  Neck:     Musculoskeletal: Normal range of motion.     Vascular: No JVD.  Cardiovascular:     Rate and Rhythm: Normal rate and regular rhythm.     Heart sounds: Normal heart sounds. No murmur. No friction rub. No gallop.   Pulmonary:     Effort: Pulmonary effort is normal. No respiratory distress.     Breath sounds: Normal breath sounds. No wheezing or rales.  Chest:     Chest wall: No tenderness.  Abdominal:     Palpations: Abdomen is soft.     Tenderness: There is no abdominal tenderness.  Musculoskeletal: Normal range of motion.  Skin:    General: Skin is warm.     Findings: No erythema or rash.  Neurological:     Mental Status: She is alert.  Psychiatric:        Attention and Perception: Attention normal.        Mood and Affect: Mood is anxious.        Speech: Speech normal.        Behavior: Behavior normal. Behavior is cooperative.    Mental Status:  Alert, oriented, thought content appropriate, able to give a coherent history. Speech fluent without evidence of aphasia. Able to follow 2 step commands without difficulty.  Cranial Nerves:  II:  Peripheral visual fields grossly normal, pupils equal, round, reactive to light III,IV, VI: ptosis not present, extra-ocular motions intact bilaterally  V,VII: smile symmetric, facial light touch sensation equal VIII: hearing grossly normal to voice  IX,X: symmetric elevation of soft palate, uvula elevates symmetrically  XI: bilateral shoulder shrug symmetric and strong XII: midline tongue extension without fassiculations Motor:  Normal tone. 5/5 in upper and lower extremities bilaterally including strong and equal grip strength and dorsiflexion/plantar flexion Sensory: Pinprick and light touch normal in all extremities.  Deep Tendon Reflexes: 2+ and symmetric in the biceps and patella Cerebellar: normal finger-to-nose with bilateral  upper extremities Gait: normal gait and balance.  Negative pronator drift. Negative Romberg sign. CV: distal pulses palpable throughout    ED Treatments / Results  Labs (all labs ordered are listed, but only abnormal results are displayed) Labs Reviewed  BASIC METABOLIC PANEL - Abnormal; Notable for the following components:      Result Value   Sodium 134 (*)    All other components within normal limits  CBC - Abnormal; Notable for the following components:   Hemoglobin 11.9 (*)    All other components within normal limits  TROPONIN I  D-DIMER, QUANTITATIVE (NOT AT [facility])  I-STAT BETA HCG BLOOD, ED ([facility], [facility], [facility] ONLY)    EKG EKG Interpretation  Date/Time:  Sunday May 03 2018 13:00:23 EDT Ventricular Rate:  75 PR Interval:    QRS Duration: 95 QT Interval:  412 QTC Calculation: 461 R Axis:   47 Text Interpretation:  Sinus rhythm Low voltage, precordial leads Baseline wander in lead(s) V2 similar to prior 5/18 Confirmed by Meridee Score [phone])  on 05/03/2018 1:10:44 PM   Radiology Dg Chest 2 View  Result Date: 05/03/2018 CLINICAL DATA:  Two week history of fatigue, chest tightness and tingling in the face with several recent anxiety attacks. EXAM: CHEST - 2 VIEW COMPARISON:  06/02/2016. FINDINGS: Cardiomediastinal silhouette unremarkable. Lungs clear. Bronchovascular markings normal. Pulmonary vascularity normal. No pneumothorax. No pleural effusions. Visualized bony thorax intact. No interval change. IMPRESSION: Normal and stable examination. Electronically Signed   By: Hulan Saas M.D.   On: 05/03/2018 16:29    Procedures Procedures (including critical care time)  Medications Ordered in ED Medications - No data to display   Initial Impression / Assessment and Plan / ED Course  I have reviewed the triage vital signs and the nursing notes.  Pertinent labs & imaging results that were available during my care of the patient were reviewed by me and considered in my  medical decision making (see chart for details).  Clinical Course as of May 03 1731  Sun May 03, 2018  1653 No active cardiopulmonary disease.   DG Chest 2 View [AH]    Clinical Course User Index [AH] Leretha Dykes, PA-C      Patient is to be discharged with recommendation to follow up with PCP in regards to today's hospital visit. Chest pain is not likely of cardiac or pulmonary etiology d/t presentation, d dimer negative, VSS, no tracheal deviation, no JVD or new murmur, RRR, breath sounds equal bilaterally, EKG without acute abnormalities, negative troponin, and negative CXR. Suspect symptoms are likely due to anxiety based on history and physical exam. Patient has been asymptomatic while in the ER. Pt has been advised to return to the ED if CP becomes exertional, associated with diaphoresis or nausea, radiates to left jaw/arm, worsens or becomes concerning in any way. Pt appears reliable for follow up and is agreeable to discharge.    Final Clinical Impressions(s) / ED Diagnoses   Final diagnoses:  Nonspecific chest pain    ED Discharge Orders    None       Leretha Dykes, [place] 05/03/18 1735    Alvira Monday, MD 05/04/18 [phone]

## 2018-10-12 ENCOUNTER — Other Ambulatory Visit: Payer: Self-pay

## 2018-10-12 ENCOUNTER — Encounter (HOSPITAL_COMMUNITY): Payer: Self-pay | Admitting: *Deleted

## 2018-10-12 ENCOUNTER — Inpatient Hospital Stay (HOSPITAL_COMMUNITY)
Admission: AD | Admit: 2018-10-12 | Discharge: 2018-10-12 | Discharge status: Against Medical Advice | Payer: 59 | Attending: Obstetrics & Gynecology | Admitting: Obstetrics & Gynecology

## 2018-10-12 DIAGNOSIS — Z3A01 Less than 8 weeks gestation of pregnancy: Secondary | ICD-10-CM | POA: Diagnosis not present

## 2018-10-12 DIAGNOSIS — R079 Chest pain, unspecified: Secondary | ICD-10-CM | POA: Insufficient documentation

## 2018-10-12 DIAGNOSIS — O26891 Other specified pregnancy related conditions, first trimester: Secondary | ICD-10-CM | POA: Insufficient documentation

## 2018-10-12 DIAGNOSIS — Z5321 Procedure and treatment not carried out due to patient leaving prior to being seen by health care provider: Secondary | ICD-10-CM | POA: Diagnosis not present

## 2018-10-12 LAB — URINALYSIS, ROUTINE W REFLEX MICROSCOPIC
Bilirubin Urine: NEGATIVE
Glucose, UA: NEGATIVE mg/dL
Hgb urine dipstick: NEGATIVE
Ketones, ur: NEGATIVE mg/dL
Leukocytes,Ua: NEGATIVE
Nitrite: NEGATIVE
Protein, ur: NEGATIVE mg/dL
Specific Gravity, Urine: 1.019 (ref 1.005–1.030)
pH: 6 (ref 5.0–8.0)

## 2018-10-12 LAB — POCT PREGNANCY, URINE: Preg Test, Ur: POSITIVE — AB

## 2018-10-12 NOTE — MAU Note (Signed)
Called pt 3 times and pt not in lobby

## 2018-10-12 NOTE — MAU Note (Signed)
.   Kimberly Parks is a 33 y.o. at [redacted]w[redacted]d here in MAU reporting: tightness in her upper chest area under her breast since yesterday. Pt states that she has to to levonox with her pregnancy due to clotting disorder LMP: 08/21/18 Onset of complaint: yesterday Pain score: 7 Vitals:   10/12/18 1520  BP: 131/63  Pulse: 79  Resp: 16  Temp: 98 F (36.7 C)  SpO2: 99%     FHT: Lab orders placed from triage UA:

## 2018-10-12 NOTE — MAU Note (Signed)
Pt. Not in lobby when called °

## 2018-10-30 LAB — OB RESULTS CONSOLE HEPATITIS B SURFACE ANTIGEN: Hepatitis B Surface Ag: NEGATIVE

## 2018-10-30 LAB — OB RESULTS CONSOLE RUBELLA ANTIBODY, IGM: Rubella: IMMUNE

## 2018-10-30 LAB — OB RESULTS CONSOLE ANTIBODY SCREEN: Antibody Screen: NEGATIVE

## 2018-10-30 LAB — OB RESULTS CONSOLE GC/CHLAMYDIA
Chlamydia: NEGATIVE
Gonorrhea: NEGATIVE

## 2018-10-30 LAB — OB RESULTS CONSOLE HIV ANTIBODY (ROUTINE TESTING): HIV: NONREACTIVE

## 2018-10-30 LAB — OB RESULTS CONSOLE ABO/RH: RH Type: POSITIVE

## 2018-10-30 LAB — OB RESULTS CONSOLE RPR: RPR: NONREACTIVE

## 2018-11-21 ENCOUNTER — Other Ambulatory Visit: Payer: Self-pay

## 2018-11-21 ENCOUNTER — Inpatient Hospital Stay (HOSPITAL_COMMUNITY)
Admission: AD | Admit: 2018-11-21 | Discharge: 2018-11-21 | Disposition: A | Discharge status: Home / Self Care | Payer: 59 | Attending: Obstetrics & Gynecology | Admitting: Obstetrics & Gynecology

## 2018-11-21 ENCOUNTER — Encounter (HOSPITAL_COMMUNITY): Payer: Self-pay

## 2018-11-21 DIAGNOSIS — F419 Anxiety disorder, unspecified: Secondary | ICD-10-CM | POA: Diagnosis not present

## 2018-11-21 DIAGNOSIS — R42 Dizziness and giddiness: Secondary | ICD-10-CM

## 2018-11-21 DIAGNOSIS — O26891 Other specified pregnancy related conditions, first trimester: Secondary | ICD-10-CM | POA: Diagnosis not present

## 2018-11-21 DIAGNOSIS — O99012 Anemia complicating pregnancy, second trimester: Secondary | ICD-10-CM | POA: Insufficient documentation

## 2018-11-21 DIAGNOSIS — R55 Syncope and collapse: Secondary | ICD-10-CM

## 2018-11-21 DIAGNOSIS — O99342 Other mental disorders complicating pregnancy, second trimester: Secondary | ICD-10-CM | POA: Diagnosis not present

## 2018-11-21 DIAGNOSIS — O26892 Other specified pregnancy related conditions, second trimester: Secondary | ICD-10-CM | POA: Diagnosis not present

## 2018-11-21 DIAGNOSIS — D649 Anemia, unspecified: Secondary | ICD-10-CM | POA: Diagnosis not present

## 2018-11-21 DIAGNOSIS — Z7982 Long term (current) use of aspirin: Secondary | ICD-10-CM | POA: Insufficient documentation

## 2018-11-21 DIAGNOSIS — Z79899 Other long term (current) drug therapy: Secondary | ICD-10-CM | POA: Diagnosis not present

## 2018-11-21 DIAGNOSIS — Z3A13 13 weeks gestation of pregnancy: Secondary | ICD-10-CM | POA: Diagnosis not present

## 2018-11-21 LAB — CBC
HCT: 32 % — ABNORMAL LOW (ref 36.0–46.0)
Hemoglobin: 10.7 g/dL — ABNORMAL LOW (ref 12.0–15.0)
MCH: 27 pg (ref 26.0–34.0)
MCHC: 33.4 g/dL (ref 30.0–36.0)
MCV: 80.6 fL (ref 80.0–100.0)
Platelets: 237 10*3/uL (ref 150–400)
RBC: 3.97 MIL/uL (ref 3.87–5.11)
RDW: 13.2 % (ref 11.5–15.5)
WBC: 10.9 10*3/uL — ABNORMAL HIGH (ref 4.0–10.5)
nRBC: 0 % (ref 0.0–0.2)

## 2018-11-21 LAB — COMPREHENSIVE METABOLIC PANEL
ALT: 17 U/L (ref 0–44)
AST: 12 U/L — ABNORMAL LOW (ref 15–41)
Albumin: 3.2 g/dL — ABNORMAL LOW (ref 3.5–5.0)
Alkaline Phosphatase: 37 U/L — ABNORMAL LOW (ref 38–126)
Anion gap: 7 (ref 5–15)
BUN: 9 mg/dL (ref 6–20)
CO2: 23 mmol/L (ref 22–32)
Calcium: 9.2 mg/dL (ref 8.9–10.3)
Chloride: 105 mmol/L (ref 98–111)
Creatinine, Ser: 0.56 mg/dL (ref 0.44–1.00)
GFR calc Af Amer: >60 mL/min (ref >60)
GFR calc non Af Amer: >60 mL/min (ref >60)
Glucose, Bld: 101 mg/dL — ABNORMAL HIGH (ref 70–99)
Potassium: 3.6 mmol/L (ref 3.5–5.1)
Sodium: 135 mmol/L (ref 135–145)
Total Bilirubin: 0.5 mg/dL (ref 0.3–1.2)
Total Protein: 6.4 g/dL — ABNORMAL LOW (ref 6.5–8.1)

## 2018-11-21 NOTE — MAU Provider Note (Signed)
Chief Complaint: Dizziness   None     SUBJECTIVE HPI: Kimberly Parks is a 33 y.o. G1P0 at [redacted]w[redacted]d by LMP who presents to maternity admissions reporting an episode of dizziness and almost passing out this morning. She reports she felt a little dizzy this morning then drove her husband to work and returned home to have breakfast. Even after eating, she felt hot and had to sit down she was so dizzy when standing.  She feels better lying down but is still dizzy when standing in MAU.  This is causing her anxiety to be worse but there are no other associated symptoms. She has not tried any treatments.     HPI  Past Medical History:  Diagnosis Date  . Anxiety   . Cerebral venous thrombosis   . Chest pain    History reviewed. No pertinent surgical history. Social History   Socioeconomic History  . Marital status: Married    Spouse name: Not on file  . Number of children: Not on file  . Years of education: Not on file  . Highest education level: Not on file  Occupational History  . Not on file  Social Needs  . Financial resource strain: Not hard at all  . Food insecurity    Worry: Never true    Inability: Never true  . Transportation needs    Medical: No    Non-medical: No  Tobacco Use  . Smoking status: Never Smoker  . Smokeless tobacco: Never Used  Substance and Sexual Activity  . Alcohol use: Yes    Comment: occasional  . Drug use: No  . Sexual activity: Yes  Lifestyle  . Physical activity    Days per week: Not on file    Minutes per session: Not on file  . Stress: Not on file  Relationships  . Social Musician on phone: Not on file    Gets together: Not on file    Attends religious service: Not on file    Active member of club or organization: Not on file    Attends meetings of clubs or organizations: Not on file    Relationship status: Not on file  . Intimate partner violence    Fear of current or ex partner: No    Emotionally abused: No    Physically  abused: No    Forced sexual activity: No  Other Topics Concern  . Not on file  Social History Narrative  . Not on file   No current facility-administered medications on file prior to encounter.    Current Outpatient Medications on File Prior to Encounter  Medication Sig Dispense Refill  . aspirin EC 81 MG tablet Take 81 mg by mouth daily.    . cetirizine (ZYRTEC) 10 MG tablet Take 10 mg by mouth daily as needed for allergies.     . clonazePAM (KLONOPIN) 0.5 MG tablet Take 0.5 mg by mouth daily as needed for anxiety.  0  . sertraline (ZOLOFT) 100 MG tablet Take 50 mg by mouth daily.   4   No Known Allergies  ROS:  Review of Systems  Constitutional: Positive for diaphoresis. Negative for chills, fatigue and fever.  Eyes: Negative for visual disturbance.  Respiratory: Negative for shortness of breath.   Cardiovascular: Negative for chest pain.  Gastrointestinal: Positive for nausea. Negative for abdominal pain and vomiting.  Genitourinary: Negative for difficulty urinating, dysuria, flank pain, pelvic pain, vaginal bleeding, vaginal discharge and vaginal pain.  Neurological: Positive for  dizziness. Negative for headaches.  Psychiatric/Behavioral: Negative.      I have reviewed patient's Past Medical Hx, Surgical Hx, Family Hx, Social Hx, medications and allergies.   Physical Exam   Patient Vitals for the past 24 hrs:  BP Temp Temp src Pulse Resp SpO2  11/21/18 1235 118/72 - - 92 - 100 %  11/21/18 1233 116/62 - - 82 - 100 %  11/21/18 1231 120/61 - - 79 - 100 %  11/21/18 1152 123/68 98.4 F (36.9 C) Oral 85 16 98 %    Constitutional: Well-developed, well-nourished female in no acute distress.  HEART: normal rate, heart sounds, regular rhythm RESP: normal effort, lung sounds clear and equal bilaterally GI: Abd soft, non-tender. Pos BS x 4 MS: Extremities nontender, no edema, normal ROM Neurologic: Alert and oriented x 4.  GU: Neg CVAT.   FHT 151 by doppler  LAB  RESULTS Results for orders placed or performed during the hospital encounter of 11/21/18 (from the past 24 hour(s))  CBC     Status: Abnormal   Collection Time: 11/21/18 12:28 PM  Result Value Ref Range   WBC 10.9 (H) 4.0 - 10.5 K/uL   RBC 3.97 3.87 - 5.11 MIL/uL   Hemoglobin 10.7 (L) 12.0 - 15.0 g/dL   HCT 82.932.0 (L) 56.236.0 - 13.046.0 %   MCV 80.6 80.0 - 100.0 fL   MCH 27.0 26.0 - 34.0 pg   MCHC 33.4 30.0 - 36.0 g/dL   RDW 86.513.2 78.411.5 - 69.615.5 %   Platelets 237 150 - 400 K/uL   nRBC 0.0 0.0 - 0.2 %  Comprehensive metabolic panel     Status: Abnormal   Collection Time: 11/21/18 12:28 PM  Result Value Ref Range   Sodium 135 135 - 145 mmol/L   Potassium 3.6 3.5 - 5.1 mmol/L   Chloride 105 98 - 111 mmol/L   CO2 23 22 - 32 mmol/L   Glucose, Bld 101 (H) 70 - 99 mg/dL   BUN 9 6 - 20 mg/dL   Creatinine, Ser 2.950.56 0.44 - 1.00 mg/dL   Calcium 9.2 8.9 - 28.410.3 mg/dL   Total Protein 6.4 (L) 6.5 - 8.1 g/dL   Albumin 3.2 (L) 3.5 - 5.0 g/dL   AST 12 (L) 15 - 41 U/L   ALT 17 0 - 44 U/L   Alkaline Phosphatase 37 (L) 38 - 126 U/L   Total Bilirubin 0.5 0.3 - 1.2 mg/dL   GFR calc non Af Amer >60 >60 mL/min   GFR calc Af Amer >60 >60 mL/min   Anion gap 7 5 - 15       IMAGING No results found.  MAU Management/MDM: Orders Placed This Encounter  Procedures  . CBC  . Comprehensive metabolic panel  . Orthostatic vital signs    No orders of the defined types were placed in this encounter.   Pt with mild anemia, electrolytes wnl.  Orthostatic VS without change in BP or pulse.  EKG wnl per EMS.  Consult Dr Vergie LivingPickens with assessment and findings.  Pt Ok for discharge, recommend eat small frequent meals and stay hydrated, increase sodium intake in the next few days. Continue PNV.  F/U with OB provider next week if symptoms persist. Consider further cardiology evaluation if persistent.    Pt discharged with strict discharge precautions.  ASSESSMENT 1. Postural dizziness with near syncope   2. Mild anemia     PLAN Discharge home Allergies as of 11/21/2018   No Known Allergies  Medication List    TAKE these medications   cetirizine 10 MG tablet Commonly known as: ZYRTEC Take 10 mg by mouth daily as needed for allergies.   clonazePAM 0.5 MG tablet Commonly known as: KLONOPIN Take 0.5 mg by mouth daily as needed for anxiety.   sertraline 100 MG tablet Commonly known as: ZOLOFT Take 50 mg by mouth daily.     ASK your doctor about these medications   aspirin EC 81 MG tablet Take 81 mg by mouth daily.         Fatima Blank Certified Nurse-Midwife 11/21/2018  1:23 PM

## 2018-11-21 NOTE — MAU Note (Signed)
Pt reports to mau via ems with c/o dizziness.  Pt denies syncopal episode.  Pt denies vag bleeding or dc.  Pt reports feeling worse upon standing.  FHR 151   BP 123/68 HR  84

## 2018-11-27 ENCOUNTER — Other Ambulatory Visit (HOSPITAL_COMMUNITY): Payer: Self-pay | Admitting: Obstetrics & Gynecology

## 2018-12-01 ENCOUNTER — Other Ambulatory Visit (HOSPITAL_COMMUNITY): Payer: Self-pay | Admitting: Obstetrics & Gynecology

## 2018-12-01 DIAGNOSIS — Z3A16 16 weeks gestation of pregnancy: Secondary | ICD-10-CM

## 2018-12-01 DIAGNOSIS — O280 Abnormal hematological finding on antenatal screening of mother: Secondary | ICD-10-CM

## 2018-12-01 DIAGNOSIS — Z363 Encounter for antenatal screening for malformations: Secondary | ICD-10-CM

## 2018-12-02 ENCOUNTER — Encounter (HOSPITAL_COMMUNITY): Payer: Self-pay

## 2018-12-08 ENCOUNTER — Encounter (HOSPITAL_COMMUNITY): Payer: Self-pay

## 2018-12-08 ENCOUNTER — Ambulatory Visit (HOSPITAL_COMMUNITY): Payer: 59 | Admitting: *Deleted

## 2018-12-08 ENCOUNTER — Ambulatory Visit (HOSPITAL_BASED_OUTPATIENT_CLINIC_OR_DEPARTMENT_OTHER): Payer: 59 | Admitting: Genetic Counselor

## 2018-12-08 ENCOUNTER — Other Ambulatory Visit (HOSPITAL_COMMUNITY): Payer: Self-pay | Admitting: Obstetrics & Gynecology

## 2018-12-08 ENCOUNTER — Ambulatory Visit (HOSPITAL_COMMUNITY): Payer: Self-pay | Admitting: Genetic Counselor

## 2018-12-08 ENCOUNTER — Other Ambulatory Visit (HOSPITAL_COMMUNITY): Payer: Self-pay | Admitting: *Deleted

## 2018-12-08 ENCOUNTER — Other Ambulatory Visit: Payer: Self-pay

## 2018-12-08 ENCOUNTER — Ambulatory Visit (HOSPITAL_COMMUNITY)
Admission: RE | Admit: 2018-12-08 | Discharge: 2018-12-08 | Disposition: A | Discharge status: Home / Self Care | Payer: 59 | Source: Ambulatory Visit | Attending: Obstetrics and Gynecology | Admitting: Obstetrics and Gynecology

## 2018-12-08 VITALS — BP 126/67 | HR 93 | Temp 98.0°F | Ht 62.0 in | Wt 169.8 lb

## 2018-12-08 DIAGNOSIS — Z3A15 15 weeks gestation of pregnancy: Secondary | ICD-10-CM

## 2018-12-08 DIAGNOSIS — O285 Abnormal chromosomal and genetic finding on antenatal screening of mother: Secondary | ICD-10-CM | POA: Insufficient documentation

## 2018-12-08 DIAGNOSIS — O280 Abnormal hematological finding on antenatal screening of mother: Secondary | ICD-10-CM

## 2018-12-08 DIAGNOSIS — O09292 Supervision of pregnancy with other poor reproductive or obstetric history, second trimester: Secondary | ICD-10-CM | POA: Diagnosis not present

## 2018-12-08 DIAGNOSIS — O289 Unspecified abnormal findings on antenatal screening of mother: Secondary | ICD-10-CM

## 2018-12-08 DIAGNOSIS — Z3A16 16 weeks gestation of pregnancy: Secondary | ICD-10-CM

## 2018-12-08 DIAGNOSIS — Z363 Encounter for antenatal screening for malformations: Secondary | ICD-10-CM | POA: Diagnosis present

## 2018-12-08 DIAGNOSIS — Z315 Encounter for genetic counseling: Secondary | ICD-10-CM | POA: Diagnosis not present

## 2018-12-08 NOTE — Progress Notes (Signed)
12/08/2018  Simrit Gohlke [birth year] MRN: [id number] DOV: 12/08/2018  Ms. Jamar presented to the [facility] for a genetics consultation regarding abnormal noninvasive prenatal screening (NIPS) results demonstrating an increased risk for triploidy, trisomy 44, or trisomy 67 in the pregnancy. Ms. Weist came to her appointment alone due to COVID-19 visitor restrictions.   Indication for genetic counseling - NIPS high risk for triploidy, trisomy 10, or trisomy 69 due to repeated insufficient fetal DNA fraction  Prenatal history  Ms. Peffley is a G2P1001, 33 y.o. female. Her current pregnancy has completed [redacted]w[redacted]d (Estimated Date of Delivery: 05/26/19).  Ms. Gilder denied exposure to environmental toxins or chemical agents. She denied the use of alcohol, tobacco or street drugs. She reported taking prenatal vitamins, Lovenox, and Sertraline. She denied significant viral illnesses, fevers, and bleeding during the course of her pregnancy. Her medical and surgical histories were noncontributory.  Family History  A three generation pedigree was drafted and reviewed. The family history is remarkable for the following:  - Ms. Rincon's father reportedly has a heart murmur. The exact etiology of this murmur is unknown and records are not available. There are multifactorial causes for isolated congenital heart defects (CHDs), including environmental and genetic factors. As such, the recurrence risk for second degree relatives is ~1%. We discussed that without knowing the exact etiology, the risk for a child of an affected mother is approximately 5-6%. We discussed that anatomy ultrasound is able to detect many CHDs, though other may only be detectable by fetal echocardiogram or postnatal imaging.  - Ms. Cagley's sister and paternal aunt have Hashimoto's disease. We discussed that this is one condition in the family of conditions known as autoimmune conditions. Autoimmune  conditions occur when an individual's body launches an abnormal immune response and begins to destroy its own cells. There are several different autoimmune conditions. While we do know that autoimmune conditions tend to "cluster" within a family, they do not follow a clear pattern of inheritance. When there is a person in the family with an autoimmune condition, there is an increased chance for others in the family to develop an autoimmune condition, but it may be a different condition. Specific risk factor information is not available. Genetic testing is not available at this time to predict who may develop an autoimmune condition.  The remaining family histories were reviewed and found to be noncontributory for birth defects, intellectual disability, recurrent pregnancy loss, and known genetic conditions.    The patient's ethnicity is [place], [place], and Native American ([place]). The father of the pregnancy's ethnicity is [place]. Ashkenazi Jewish ancestry and consanguinity were denied. Pedigree will be scanned under Media.  Discussion  Ms.Arcoviohad Panorama noninvasive prenatal screening (NIPS) through the laboratory [facility] thatwas high risk due toinsufficientfetal DNA fraction of 3.0%. Given the insufficient fetal fraction, an additional analysis was performedby [facility] identified the pregnancy as being at high risk (1 in 50) for triploidy, trisomy 63, and trisomy 30.   Panorama NIPS utilizessingle nucleotide polymorphisms (SNPs)to distinguish maternal from fetal (placental) DNA. The term "fetal fraction" refers to the amount of sample that is believed to have come from fetal DNA rather than maternal DNA.We reviewed that there are many possible reasons a sample may have a low fetal fraction, including early gestational age, high maternal BMI, suboptimal sample collection, maternal use of medications like low molecular weight heparin, pregnancy loss, pregnancy complications, and  normal variation. Low fetal fraction is also associated with aneuploidy in20-30% of cases. Pregnancies  affected by triploidy, trisomy 6213, and trisomy 7918 have all been associated with low fetal fraction on NIPS; however,there is no increased incidence of trisomy 3121 or monosomy X in cases with low fetal fraction. It is thought that pregnancies with triploidy, trisomy 5313, and trisomy 18 may have smaller placentas, which could result in an insufficient fetal fraction. We briefly reviewed that all triploidy, trisomy 4313, and trisomy 5318 are lethal conditions that impact various organ systems throughout the body. Fetuses with triploidy, trisomy 6413, and trisomy 4818 often pass away during pregnancy; affected infants often pass away very shortly after birth, and most do not survive beyond 33 year of age.  Ms. Izora Ribasrcovio is currently taking Lovenox, which is a form of low molecular weight heparin. She reported that she had not begun taking Lovenox until after her first NIPS result that was drawn at 10 weeks' gestation, which failed due to low fetal fraction. In between her first and second sample draws for NIPS, Ms. Morrissey received 1-2 doses of Lovenox. Her second NIPS result also failed due to low fetal fraction. Studies have indicated that Lovenox therapy is associated with an increased incidence of failed NIPS due to low fetal fraction (Gromminger et al., 2015; Burns et al., 2017). We discussed that Ms. Hirschhorn's failed NIPS result maynot indicate that there is anything wrong with her baby; rather, her early gestational age and personal history of Lovenox therapy could have contributed to an insufficient amount of fetal DNA being present in her samples, rendering NIPS analysis impossible.   A complete ultrasound was performed today prior to our visit. The ultrasound report will be sent under separate cover. There were no visualized fetal anomalies or markers suggestive of aneuploidy. Ms. Izora Ribasrcovio was counseled that  approximately 50% of fetuses with Down syndrome and 90-95% of fetuses with trisomy 7613, trisomy 4818, or triploidy demonstrate a sign of the respective conditions on anatomy ultrasound. For this reason, a normal ultrasound further decreases the suspicion for trisomy 3421, trisomy 3313, trisomy 5318, and triploidy in the pregnancy.  We discussed additional aneuploidy screening options that are available to Ms. Butzin. Firstly, we reviewed that redrawing a new sample for NIPS was possible. If the redraw was successful and contained a sufficient fetal fraction, results could clarify the risks for chromosomal aneuploidy in the current pregnancy. Ms. Izora Ribasrcovio was counseled that we could pursue NIPS through a different laboratory that is able to analyze samples with lower fetal fraction. Secondly, Ms. Wallis was counseledthat quad screening is another form of aneuploidy screening that is available at this point in pregnancy. Instead of utilizing fetal DNA, quad screening measures four proteins in a pregnant woman's blood. The levels of these proteins can help identify if a pregnancy is at high risk for trisomy 2821 (Down syndrome), trisomy 18, and open neural tube defects (ONTDs). Quad screening detects 75-80% of cases of Down syndrome, 73% of cases of trisomy 18, and 80-90% of ONTDs. It cannot assess for the risk of trisomy 1313 or triploidy in a pregnancy. Ms. Izora Ribasrcovio was not interested in pursuing quad screening. She indicated that she may be interested in pursuing NIPS through a different laboratory, but wanted to first discuss this option with her husband.  Ms. Izora Ribasrcovio was also counseled regarding diagnostic testing via amniocentesis. We discussed the technical aspects of the procedure and quoted up to a 1 in 500 (0.2%) risk for spontaneous pregnancy loss or other adverse pregnancy outcomes as a result of amniocentesis. Cultured cells from an amniocentesis sample  allow for the visualization of a fetal karyotype, which can  detect >99% of chromosomal aberrations. Chromosomal microarray can also be performed to identify smaller deletions or duplications of fetal chromosomal material.After careful consideration, Ms. Riordan declined amniocentesis at this time. She understands that amniocentesis is available at any point after 16 weeks of pregnancy and that she may opt to undergo the procedure at a later date should she change her mind. She indicated that she may consider undergoing amniocentesis if any ultrasound anomalies are identified on future scans.  Per ACOG recommendation, carrier screening for hemoglobinopathies, cystic fibrosis (CF) and spinal muscular atrophy (SMA) was discussed including information about the conditions, rationale for testing, autosomal recessive inheritance, and the option of prenatal diagnosis. Ms. Honea indicated that she may be interested in undergoing carrier screening; however, she wished to discuss this with her husband before making a decision. She was also informed that select hemoglobinopathies and CF are included on [place]'s newborn screen, but that SMA currently is not included. We discussed the Early Check research study to add SMA to her baby's newborn screening panel. Ms. Strickland indicated that she was interested in pursuing this, so she was given written information on how to enroll in the Early Check study. Without carrier screening to refine risk and based on ethnicity alone, Ms. Rudnick's risk to be a carrier of CF is 1 in 69. Her risk to be a carrier of SMA is 1 in 84. Her risk to be a carrier of HBB-related hemoglobinopathies is 1 in 46.    Lastly, the patient was made aware that screening for open neural tube defects (ONTDs) via MS-AFP in the second trimester in addition to level II ultrasound examination is recommended. We reviewed that Ms. Sterbenz's level II ultrasound did not detect any ONTDs, and that level II ultrasound is able to detect them with 90-95% sensitivity.  However, normal results from any of the above options do not guarantee a normal baby, as 3-5% of newborns have some type of birth defect, many of which are not prenatally diagnosable.  Ms. Magistro was potentially interested in pursuing NIPS and carrier screening through the laboratory [facility]. However, she first wanted to discuss these options with her husband. I provided Ms. Doig with pamphlets on NIPS and carrier screening from [facility] and encouraged her to contact me when she had made a decision. If she does wish to undergo testing, I can facilitate sample collection and testing from there.   I counseled Ms. Minney regarding the above risks and available options. The approximate face-to-face time with the genetic counselor was 30 minutes.  In summary:  Discussed NIPS result and options for follow-up testing  High risk (1 in 56) for triploidy, trisomy 59, and trisomy 76 due to low fetal fraction  Lovenox and other maternal/pregnancy factors could contribute to repeatedly low fetal fraction  Declined quad screen  Potentially interested in pursuing NIPS through a laboratory that can assess samples with lower fetal fraction. Will contact me after discussing with her partner  Discussed carrier screening for cystic fibrosis, spinal muscular atrophy, and hemoglobinopathies  Potentially interested in pursuing carrier screening. Will contact me after discussing with her partner  Reviewed results of ultrasound  No fetal anomalies or markers seen  Reduction in risk for fetal aneuploidy  Offered additional testing and screening  Declined amniocentesis  Recommend MS-AFP screening   Reviewed family history concerns   Gershon Crane, MS Genetic Counselor

## 2018-12-11 ENCOUNTER — Other Ambulatory Visit (HOSPITAL_COMMUNITY): Payer: BLUE CROSS/BLUE SHIELD

## 2019-01-05 ENCOUNTER — Ambulatory Visit (HOSPITAL_COMMUNITY): Payer: 59 | Admitting: *Deleted

## 2019-01-05 ENCOUNTER — Encounter (HOSPITAL_COMMUNITY): Payer: Self-pay

## 2019-01-05 ENCOUNTER — Other Ambulatory Visit: Payer: Self-pay

## 2019-01-05 ENCOUNTER — Other Ambulatory Visit (HOSPITAL_COMMUNITY): Payer: Self-pay | Admitting: *Deleted

## 2019-01-05 ENCOUNTER — Ambulatory Visit (HOSPITAL_COMMUNITY)
Admission: RE | Admit: 2019-01-05 | Discharge: 2019-01-05 | Disposition: A | Discharge status: Home / Self Care | Payer: 59 | Source: Ambulatory Visit | Attending: Obstetrics and Gynecology | Admitting: Obstetrics and Gynecology

## 2019-01-05 VITALS — BP 124/67 | HR 96 | Temp 97.8°F

## 2019-01-05 DIAGNOSIS — Z362 Encounter for other antenatal screening follow-up: Secondary | ICD-10-CM

## 2019-01-05 DIAGNOSIS — O09292 Supervision of pregnancy with other poor reproductive or obstetric history, second trimester: Secondary | ICD-10-CM

## 2019-01-05 DIAGNOSIS — O289 Unspecified abnormal findings on antenatal screening of mother: Secondary | ICD-10-CM | POA: Diagnosis not present

## 2019-01-05 DIAGNOSIS — D6859 Other primary thrombophilia: Secondary | ICD-10-CM

## 2019-01-05 DIAGNOSIS — Z3A19 19 weeks gestation of pregnancy: Secondary | ICD-10-CM

## 2019-01-05 DIAGNOSIS — O0990 Supervision of high risk pregnancy, unspecified, unspecified trimester: Secondary | ICD-10-CM | POA: Insufficient documentation

## 2019-01-05 DIAGNOSIS — O09892 Supervision of other high risk pregnancies, second trimester: Secondary | ICD-10-CM | POA: Diagnosis not present

## 2019-01-05 DIAGNOSIS — O280 Abnormal hematological finding on antenatal screening of mother: Secondary | ICD-10-CM | POA: Insufficient documentation

## 2019-01-05 DIAGNOSIS — O99112 Other diseases of the blood and blood-forming organs and certain disorders involving the immune mechanism complicating pregnancy, second trimester: Secondary | ICD-10-CM

## 2019-01-05 DIAGNOSIS — Z86718 Personal history of other venous thrombosis and embolism: Secondary | ICD-10-CM

## 2019-01-05 NOTE — Consult Note (Signed)
MFM Note  This patient was seen for a detailed fetal anatomy scan as her cell free DNA test indicated an increased risk for trisomy 37, 13 and triploidy.  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  She is currently treated with a prophylactic dose of Lovenox (40 mg) daily.  She has declined an amniocentesis in the past for definitive diagnosis of fetal aneuploidy.  She denies any other significant past medical history and denies any problems in her current pregnancy.    She was informed that the fetal growth and amniotic fluid level were appropriate for her gestational age.  There were no obvious fetal anomalies noted on today's ultrasound exam.  The patient was informed that anomalies may be missed due to technical limitations. If the fetus is in a suboptimal position or maternal habitus is increased, visualization of the fetus in the maternal uterus may be impaired.  Due to the abnormal cell free DNA test, the patient was once again offered and declined an amniocentesis today for definitive diagnosis of fetal aneuploidy.  She was advised that the Antithrombin III deficiency is the most thrombogenic of the inherited thrombophilias.  According to the ACOG practice bulletin regarding thrombophilias in pregnancy, women with the Antithrombin III deficiency with a prior thromboembolic event have up to a 40% chance of another thromboembolic event occurring during pregnancy.  Due to the significantly elevated risk of another thromboembolic event in pregnancy, I would  recommend that the patient be switched from a prophylactic to a therapeutic dose of Lovenox.   The dosage that I would recommend is 1 mg/kg/ twice a day. She should have her anti-factor Xa levels drawn in about a month to ensure that she is in the therapeutic range (anti-factor Xa level of 0.6-1.0). The Anti-factor Xa level should be measured 4 hours after she administers the therapeutic  dose of Lovenox. The dosage of Lovenox should be adjusted based on the anti-factor Xa level.  In regards to the management of her anticoagulation and delivery, she should continue the therapeutic dose of Lovenox up until the day prior to her scheduled delivery. The nighttime dose of Lovenox in the evening prior to her scheduled delivery date and the morning dose of Lovenox on the day of her scheduled delivery should be held.   As she is treated with a therapeutic dose of anticoagulation, she may be eligible to receive regional anesthesia if it has been 24 hours or more since she received the anticoagulation medication.   She should be continued on therapeutic anticoagulation for 6 weeks postpartum.  Due to her thrombophilia and the increased risk noted on her cell free DNA test, the patient should continue to be followed with serial growth ultrasounds. A follow-up exam was scheduled in our office in 4 weeks. Following that exam, she may continue to be followed with growth ultrasounds in your office.  A total of 15 minutes was spent counseling and coordinating the care for this patient.  Greater than 50% of the time was spent in direct face-to-face contact.

## 2019-01-08 NOTE — L&D Delivery Note (Signed)
Delivery Note Labor onset:   Labor Onset Time: Complete dilation at 8:28 PM  Onset of pushing at 2035 FHR second stage Cat 1 Analgesia/Anesthesia intrapartum: Epidural  Guided pushing with strong maternal urge. Delivery fetal head in LOA position, restututed to LOT, loose nuchal x1 reduced on perineum, gentle axial traction applied to release antior shoulder, unsuccessful. Assisted mother into McRoberts position while simultaneously lowring head of bed. Shoulder not released with McRoberts. Suprapubic pressure requested from maternal left to right, increase in maternal effort, posterior shoulder released. Delivery of viable female. Placed on maternal abd, dried, and tactile stim to produce lusty cry.  Cord double clamped after 30 sec and cut by Jeannett Senior, father.  Cord blood sample collected   Placenta delivered Schultz, intact, with 3 VC.  Placenta to pathology for maternal hx of anti-thrombin 3 deficiency. Uterine tone firm bleeding small w/ one clot  2nd degree laceration identified.  Anesthesia: Epidural Repair: 4-0and 2-0Vicryl QBL (mL): 242 Complications: <30 sec shoulder dystocia released with McRoberts and suprapubic pressure from maternal left to right.  APGAR: APGAR (1 MIN): 6   APGAR (5 MINS): 9   APGAR (10 MINS):   Mom to postpartum.  Baby to Couplet care / Skin to Skin   Per Dr. Charlotta Newton, pt to resume Lovenox 4 hours after epidural cath removed. See order for details.   Roma Schanz MSN, CNM 05/18/2019, 10:48 PM

## 2019-02-02 ENCOUNTER — Ambulatory Visit (HOSPITAL_COMMUNITY): Payer: BLUE CROSS/BLUE SHIELD

## 2019-02-11 ENCOUNTER — Ambulatory Visit (HOSPITAL_COMMUNITY): Payer: BLUE CROSS/BLUE SHIELD

## 2019-02-12 ENCOUNTER — Encounter (HOSPITAL_COMMUNITY): Payer: Self-pay | Admitting: *Deleted

## 2019-02-12 ENCOUNTER — Other Ambulatory Visit: Payer: Self-pay

## 2019-02-12 ENCOUNTER — Ambulatory Visit (HOSPITAL_COMMUNITY): Payer: 59 | Admitting: *Deleted

## 2019-02-12 ENCOUNTER — Ambulatory Visit (HOSPITAL_COMMUNITY)
Admission: RE | Admit: 2019-02-12 | Discharge: 2019-02-12 | Disposition: A | Discharge status: Home / Self Care | Payer: 59 | Source: Ambulatory Visit | Attending: Obstetrics | Admitting: Obstetrics

## 2019-02-12 VITALS — BP 116/68 | HR 85 | Temp 97.2°F

## 2019-02-12 DIAGNOSIS — Z3A25 25 weeks gestation of pregnancy: Secondary | ICD-10-CM

## 2019-02-12 DIAGNOSIS — Z362 Encounter for other antenatal screening follow-up: Secondary | ICD-10-CM | POA: Diagnosis present

## 2019-02-12 DIAGNOSIS — O09292 Supervision of pregnancy with other poor reproductive or obstetric history, second trimester: Secondary | ICD-10-CM | POA: Diagnosis not present

## 2019-02-12 DIAGNOSIS — O280 Abnormal hematological finding on antenatal screening of mother: Secondary | ICD-10-CM | POA: Diagnosis present

## 2019-03-10 ENCOUNTER — Encounter: Payer: 59 | Attending: Obstetrics & Gynecology | Admitting: Registered"

## 2019-03-10 DIAGNOSIS — O99810 Abnormal glucose complicating pregnancy: Secondary | ICD-10-CM | POA: Insufficient documentation

## 2019-03-17 ENCOUNTER — Encounter: Payer: 59 | Admitting: Registered"

## 2019-03-17 ENCOUNTER — Other Ambulatory Visit: Payer: Self-pay

## 2019-03-17 DIAGNOSIS — O99810 Abnormal glucose complicating pregnancy: Secondary | ICD-10-CM

## 2019-03-18 ENCOUNTER — Encounter: Payer: Self-pay | Admitting: Registered"

## 2019-03-18 DIAGNOSIS — O99810 Abnormal glucose complicating pregnancy: Secondary | ICD-10-CM | POA: Insufficient documentation

## 2019-03-18 NOTE — Progress Notes (Signed)
Patient was seen on 03/17/19 for Gestational Diabetes self-management class at [facility]. The following learning objectives were met by the patient during this course:   States the definition of Gestational Diabetes  States why dietary management is important in controlling blood glucose  Describes the effects each nutrient has on blood glucose levels  Demonstrates ability to create a balanced meal plan  Demonstrates carbohydrate counting   States when to check blood glucose levels  Demonstrates proper blood glucose monitoring techniques  States the effect of stress and exercise on blood glucose levels  States the importance of limiting caffeine and abstaining from alcohol and smoking  Blood glucose monitor given: none  Patient instructed to monitor glucose levels: FBS: 60 - <95; 1 hour: <140; 2 hour: <120  Patient received handouts:  Nutrition Diabetes and Pregnancy, including carb counting list  Patient will be seen for follow-up as needed.

## 2019-04-10 LAB — OB RESULTS CONSOLE GBS: GBS: NEGATIVE

## 2019-05-10 ENCOUNTER — Telehealth (HOSPITAL_COMMUNITY): Payer: Self-pay | Admitting: *Deleted

## 2019-05-10 ENCOUNTER — Encounter (HOSPITAL_COMMUNITY): Payer: Self-pay | Admitting: *Deleted

## 2019-05-10 NOTE — Telephone Encounter (Signed)
Preadmission screen  

## 2019-05-12 ENCOUNTER — Encounter (HOSPITAL_COMMUNITY): Payer: Self-pay | Admitting: *Deleted

## 2019-05-16 ENCOUNTER — Other Ambulatory Visit: Payer: Self-pay

## 2019-05-16 ENCOUNTER — Inpatient Hospital Stay (EMERGENCY_DEPARTMENT_HOSPITAL)
Admission: AD | Admit: 2019-05-16 | Discharge: 2019-05-17 | Disposition: A | Discharge status: Home / Self Care | Payer: 59 | Source: Home / Self Care | Attending: Obstetrics and Gynecology | Admitting: Obstetrics and Gynecology

## 2019-05-16 ENCOUNTER — Encounter (HOSPITAL_COMMUNITY): Payer: Self-pay | Admitting: Obstetrics and Gynecology

## 2019-05-16 DIAGNOSIS — R42 Dizziness and giddiness: Secondary | ICD-10-CM

## 2019-05-16 DIAGNOSIS — Z3A38 38 weeks gestation of pregnancy: Secondary | ICD-10-CM | POA: Insufficient documentation

## 2019-05-16 DIAGNOSIS — O26893 Other specified pregnancy related conditions, third trimester: Secondary | ICD-10-CM | POA: Insufficient documentation

## 2019-05-16 DIAGNOSIS — R519 Headache, unspecified: Secondary | ICD-10-CM

## 2019-05-16 LAB — CBC
HCT: 37.3 % (ref 36.0–46.0)
Hemoglobin: 11.9 g/dL — ABNORMAL LOW (ref 12.0–15.0)
MCH: 26.4 pg (ref 26.0–34.0)
MCHC: 31.9 g/dL (ref 30.0–36.0)
MCV: 82.9 fL (ref 80.0–100.0)
Platelets: 206 10*3/uL (ref 150–400)
RBC: 4.5 MIL/uL (ref 3.87–5.11)
RDW: 17.2 % — ABNORMAL HIGH (ref 11.5–15.5)
WBC: 11.7 10*3/uL — ABNORMAL HIGH (ref 4.0–10.5)
nRBC: 0 % (ref 0.0–0.2)

## 2019-05-16 LAB — URINALYSIS, ROUTINE W REFLEX MICROSCOPIC
Bilirubin Urine: NEGATIVE
Glucose, UA: NEGATIVE mg/dL
Hgb urine dipstick: NEGATIVE
Ketones, ur: NEGATIVE mg/dL
Nitrite: NEGATIVE
Protein, ur: NEGATIVE mg/dL
Specific Gravity, Urine: 1.008 (ref 1.005–1.030)
pH: 7 (ref 5.0–8.0)

## 2019-05-16 LAB — PROTEIN / CREATININE RATIO, URINE
Creatinine, Urine: 44.31 mg/dL
Protein Creatinine Ratio: 0.25 mg/mg{Cre} — ABNORMAL HIGH (ref 0.00–0.15)
Total Protein, Urine: 11 mg/dL

## 2019-05-16 MED ORDER — LACTATED RINGERS IV BOLUS
1000.0000 mL | Freq: Once | INTRAVENOUS | Status: AC
  Administered 2019-05-16: 1000 mL via INTRAVENOUS

## 2019-05-16 MED ORDER — BUTALBITAL-APAP-CAFFEINE 50-325-40 MG PO TABS
2.0000 | ORAL_TABLET | Freq: Once | ORAL | Status: AC
  Administered 2019-05-16: 23:00:00 2 via ORAL
  Filled 2019-05-16: qty 2

## 2019-05-16 NOTE — MAU Note (Signed)
Pt reports to MAU c/o a HA and dizziness that started today. Pt states with the symptoms she took her BP and it was 150/138 (pt states the machine is very old). Pt reports +FM. No bleeding or LOF. Occ braxton hicks.

## 2019-05-16 NOTE — MAU Provider Note (Signed)
Patient Kimberly Parks is a 34 y.o.  G2P1001  at  [redacted]w[redacted]d here with complaints of headache and dizziness that she felt at home at 945 pm. She took her BP with her mother in law's cuff and it was 150/130 and her pulse was 112.   She denies LOF, abnormal discharge, decreased fetal movements. She is a gestational diabetic, diet-controlled. She denies vaginal bleeding, contractions, dysuria. She denies blurry vision, nausea, vomiting, floating spots, RUQ pain.   Reports that she "laid on the couch most of the day"; she feels like she ate normally. She does not think she overdid it. Last blood sugar at 10 pm was 91.   History     CSN: [id number]  Arrival date and time: 05/16/19 2158   None     Chief Complaint  Patient presents with  . Hypertension  . Headache  . Dizziness   Hypertension This is a new problem. The current episode started today. Associated symptoms include headaches. Pertinent negatives include no blurred vision or shortness of breath.  Headache  This is a new problem. The pain is located in the frontal region. The pain is at a severity of 3/10. Associated symptoms include dizziness. Pertinent negatives include no abdominal pain, blurred vision, phonophobia, photophobia, visual change or vomiting. Her past medical history is significant for hypertension.  Dizziness This is a new problem. The current episode started today. The problem has been gradually worsening. Associated symptoms include headaches. Pertinent negatives include no abdominal pain, visual change or vomiting. She has tried lying down for the symptoms.    OB History    Gravida  2   Para  1   Term  1   Preterm      AB      Living  1     SAB      TAB      Ectopic      Multiple      Live Births              Past Medical History:  Diagnosis Date  . Anxiety   . Cerebral venous thrombosis   . Chest pain   . Coagulopathy (HCC)    borderline ATIII def. with h/o sagital sinus thrombosis   .  Gestational diabetes     Past Surgical History:  Procedure Laterality Date  . NO PAST SURGERIES      Family History  Problem Relation Age of Onset  . Hypertension Mother   . Thyroid disease Mother   . Hyperlipidemia Mother   . Diabetes Father   . Heart disease Father   . Bladder Cancer Maternal Grandfather   . Vision loss Maternal Grandfather        cataract  . Heart disease Paternal Grandmother   . Hypertension Paternal Grandmother   . COPD Paternal Grandmother   . Diabetes Paternal Grandmother     Social History   Tobacco Use  . Smoking status: Never Smoker  . Smokeless tobacco: Never Used  Substance Use Topics  . Alcohol use: Not Currently    Comment: occasional  . Drug use: No    Allergies: No Known Allergies  No medications prior to admission.    Review of Systems  Constitutional: Negative.   HENT: Negative.   Eyes: Negative for blurred vision and photophobia.  Respiratory: Negative for shortness of breath.   Gastrointestinal: Negative for abdominal pain and vomiting.  Genitourinary: Negative.   Musculoskeletal: Negative.   Neurological: Positive for dizziness and headaches.  Physical Exam   Blood pressure 139/73, pulse 95, temperature 97.6 F (36.4 C), temperature source Oral, resp. rate 16, weight 87.6 kg, last menstrual period 08/19/2018, SpO2 96 %.   Patient Vitals for the past 24 hrs:  BP Temp Temp src Pulse Resp SpO2 Weight  05/17/19 0050 -- -- -- -- -- 96 % --  05/17/19 0046 139/73 97.6 F (36.4 C) Oral 95 16 -- --  05/17/19 0045 -- -- -- -- -- 96 % --  05/17/19 0040 -- -- -- -- -- 96 % --  05/17/19 0035 -- -- -- -- -- 96 % --  05/17/19 0031 139/74 -- -- 90 -- -- --  05/17/19 0030 -- -- -- -- -- 96 % --  05/17/19 0020 -- -- -- -- -- 96 % --  05/17/19 0016 134/75 -- -- 91 -- -- --  05/17/19 0015 -- -- -- -- -- 96 % --  05/17/19 0010 -- -- -- -- -- 96 % --  05/17/19 0005 -- -- -- -- -- 97 % --  05/17/19 0001 139/81 -- -- 88 -- -- --   05/17/19 0000 -- -- -- -- -- 97 % --  05/16/19 2346 137/84 -- -- 90 -- -- --  05/16/19 2345 -- -- -- -- -- 97 % --  05/16/19 2340 -- -- -- -- -- 96 % --  05/16/19 2335 -- -- -- -- -- 96 % --  05/16/19 2331 129/79 -- -- 90 -- -- --  05/16/19 2330 -- -- -- -- -- 96 % --  05/16/19 2310 -- -- -- -- -- 96 % --  05/16/19 2305 -- -- -- -- -- 96 % --  05/16/19 2301 128/71 -- -- 92 -- -- --  05/16/19 2300 -- -- -- -- -- 96 % --  05/16/19 2255 -- -- -- -- -- 96 % --  05/16/19 2250 -- -- -- -- -- 96 % --  05/16/19 2246 128/72 -- -- 97 -- -- --  05/16/19 2245 -- -- -- -- -- 97 % --  05/16/19 2241 (!) 142/85 -- -- (!) 103 -- -- --  05/16/19 2240 134/84 -- -- (!) 104 -- 97 % --  05/16/19 2239 -- 99 F (37.2 C) Oral -- 18 -- --  05/16/19 2235 -- -- -- -- -- 97 % --  05/16/19 2216 (!) 142/90 98.5 F (36.9 C) Oral (!) 101 18 -- 87.6 kg     Physical Exam  Constitutional: She appears well-developed.  HENT:  Head: Normocephalic.  Respiratory: Effort normal.  GI: Soft.  Musculoskeletal:     Cervical back: Normal range of motion.  Neurological: She is alert.  Skin: Skin is warm.    MAU Course  Procedures  MDM -headache resolved with  Fioricet, 0/10 on pain scale and dizziness has mostly resolved with IV fluids - patient declined IV HA cocktail -Pre e labs were normal -Bps normal while in MAU except for 2 early elevated blood pressures; BPs did not meet criteria for pre-e or GHTN and symptoms resolved.  -NST: 135 bpm, mod var, present acel, no decels, contractions are spacing out. NST reactive.   Patient to be induced on Tuesday; has follow up tomorrow for COVID test and [place].    Assessment and Plan   1. Nonintractable headache, unspecified chronicity pattern, unspecified headache type   2. Dizziness    Patient to keep appt today for [place] this afternoon; patient will also call [facility] in the morning and update her provider.  She is scheduled for IOL on Tuesday evening; she knows that if  her blood pressure is elevated today she may be IOL earlier for [facility] or pre-e. Strict return precautions given, including pre-e and labor precautions. Advised patient not to use mother in law's cuff as it may not be accurate.   Patient and husband agree with plan of care, no questions or concerns.  Note forwarded to [facility].    Charlesetta Garibaldi Derry Kassel 05/17/2019, 1:28 AM

## 2019-05-17 ENCOUNTER — Other Ambulatory Visit (HOSPITAL_COMMUNITY)
Admission: RE | Admit: 2019-05-17 | Discharge: 2019-05-17 | Disposition: A | Discharge status: Home / Self Care | Payer: 59 | Source: Ambulatory Visit | Attending: Obstetrics & Gynecology | Admitting: Obstetrics & Gynecology

## 2019-05-17 DIAGNOSIS — O26893 Other specified pregnancy related conditions, third trimester: Secondary | ICD-10-CM

## 2019-05-17 DIAGNOSIS — R519 Headache, unspecified: Secondary | ICD-10-CM

## 2019-05-17 DIAGNOSIS — Z3A38 38 weeks gestation of pregnancy: Secondary | ICD-10-CM

## 2019-05-17 DIAGNOSIS — R42 Dizziness and giddiness: Secondary | ICD-10-CM

## 2019-05-17 LAB — COMPREHENSIVE METABOLIC PANEL
ALT: 17 U/L (ref 0–44)
AST: 18 U/L (ref 15–41)
Albumin: 2.7 g/dL — ABNORMAL LOW (ref 3.5–5.0)
Alkaline Phosphatase: 130 U/L — ABNORMAL HIGH (ref 38–126)
Anion gap: 13 (ref 5–15)
BUN: 15 mg/dL (ref 6–20)
CO2: 17 mmol/L — ABNORMAL LOW (ref 22–32)
Calcium: 9.7 mg/dL (ref 8.9–10.3)
Chloride: 107 mmol/L (ref 98–111)
Creatinine, Ser: 0.94 mg/dL (ref 0.44–1.00)
GFR calc Af Amer: >60 mL/min (ref >60)
GFR calc non Af Amer: >60 mL/min (ref >60)
Glucose, Bld: 98 mg/dL (ref 70–99)
Potassium: 4.2 mmol/L (ref 3.5–5.1)
Sodium: 137 mmol/L (ref 135–145)
Total Bilirubin: 0.4 mg/dL (ref 0.3–1.2)
Total Protein: 6.4 g/dL — ABNORMAL LOW (ref 6.5–8.1)

## 2019-05-17 LAB — SARS CORONAVIRUS 2 (TAT 6-24 HRS): SARS Coronavirus 2: NEGATIVE

## 2019-05-17 NOTE — Discharge Instructions (Signed)
-keep appt at Dr. Dawayne Patricia office today for [place] and BP check -return if Dizziness gets worse--if dizziness is only complaint, go to [facility] -return if headache gets worse    Hypertension During Pregnancy Hypertension is also called high blood pressure. High blood pressure means that the force of your blood moving in your body is too strong. It can cause problems for you and your baby. Different types of high blood pressure can happen during pregnancy. The types are:  High blood pressure before you got pregnant. This is called chronic hypertension.  This can continue during your pregnancy. Your doctor will want to keep checking your blood pressure. You may need medicine to keep your blood pressure under control while you are pregnant. You will need follow-up visits after you have your baby.  High blood pressure that goes up during pregnancy when it was normal before. This is called gestational hypertension. It will usually get better after you have your baby, but your doctor will need to watch your blood pressure to make sure that it is getting better.  Very high blood pressure during pregnancy. This is called preeclampsia. Very high blood pressure is an emergency that needs to be checked and treated right away.  You may develop very high blood pressure after giving birth. This is called postpartum preeclampsia. This usually occurs within 48 hours after childbirth but may occur up to 6 weeks after giving birth. This is rare. How does this affect me? If you have high blood pressure during pregnancy, you have a higher chance of developing high blood pressure:  As you get older.  If you get pregnant again. In some cases, high blood pressure during pregnancy can cause:  Stroke.  Heart attack.  Damage to the kidneys, lungs, or liver.  Preeclampsia.  Jerky movements you cannot control (convulsions or seizures).  Problems with the placenta. How does this affect my baby? Your baby may:  Be  born early.  Not weigh as much as he or she should.  Not handle labor well, leading to a c-section birth. What are the risks?  Having high blood pressure during a past pregnancy.  Being overweight.  Being 30 years old or older.  Being pregnant for the first time.  Being pregnant with more than one baby.  Becoming pregnant using fertility methods, such as IVF.  Having other problems, such as diabetes, or kidney disease.  Having family members who have high blood pressure. What can I do to lower my risk?   Keep a healthy weight.  Eat a healthy diet.  Follow what your doctor tells you about treating any medical problems that you had before becoming pregnant. It is very important to go to all of your doctor visits. Your doctor will check your blood pressure and make sure that your pregnancy is progressing as it should. Treatment should start early if a problem is found. How is this treated? Treatment for high blood pressure during pregnancy can differ depending on the type of high blood pressure you have and how serious it is.  You may need to take blood pressure medicine.  If you have been taking medicine for your blood pressure, you may need to change the medicine during pregnancy if it is not safe for your baby.  If your doctor thinks that you could get very high blood pressure, he or she may tell you to take a low-dose aspirin during your pregnancy.  If you have very high blood pressure, you may need to  stay in the hospital so you and your baby can be watched closely. You may also need to take medicine to lower your blood pressure. This medicine may be given by mouth or through an IV tube.  In some cases, if your condition gets worse, you may need to have your baby early. Follow these instructions at home: Eating and drinking   Drink enough fluid to keep your pee (urine) pale yellow.  Avoid caffeine. Lifestyle  Do not use any products that contain nicotine or  tobacco, such as cigarettes, e-cigarettes, and chewing tobacco. If you need help quitting, ask your doctor.  Do not use alcohol or drugs.  Avoid stress.  Rest and get plenty of sleep.  Regular exercise can help. Ask your doctor what kinds of exercise are best for you. General instructions  Take over-the-counter and prescription medicines only as told by your doctor.  Keep all prenatal and follow-up visits as told by your doctor. This is important. Contact a doctor if:  You have symptoms that your doctor told you to watch for, such as: ? Headaches. ? Nausea. ? Vomiting. ? Belly (abdominal) pain. ? Dizziness. ? Light-headedness. Get help right away if:  You have: ? Very bad belly pain that does not get better with treatment. ? A very bad headache that does not get better. ? Vomiting that does not get better. ? Sudden, fast weight gain. ? Sudden swelling in your hands, ankles, or face. ? Bleeding from your vagina. ? Blood in your pee. ? Blurry vision. ? Double vision. ? Shortness of breath. ? Chest pain. ? Weakness on one side of your body. ? Trouble talking.  Your baby is not moving as much as usual. Summary  High blood pressure is also called hypertension.  High blood pressure means that the force of your blood moving in your body is too strong.  High blood pressure can cause problems for you and your baby.  Keep all follow-up visits as told by your doctor. This is important. This information is not intended to replace advice given to you by your health care provider. Make sure you discuss any questions you have with your health care provider. Document Revised: 04/16/2018 Document Reviewed: 01/20/2018 Elsevier Patient Education  2020 [employer].  Vaginal Delivery  Vaginal delivery means that you give birth by pushing your baby out of your birth canal (vagina). A team of health care providers will help you before, during, and after vaginal delivery. Birth  experiences are unique for every woman and every pregnancy, and birth experiences vary depending on where you choose to give birth. What happens when I arrive at the birth center or hospital? Once you are in labor and have been admitted into the hospital or birth center, your health care provider may:  Review your pregnancy history and any concerns that you have.  Insert an IV into one of your veins. This may be used to give you fluids and medicines.  Check your blood pressure, pulse, temperature, and heart rate (vital signs).  Check whether your bag of water (amniotic sac) has broken (ruptured).  Talk with you about your birth plan and discuss pain control options. Monitoring Your health care provider may monitor your contractions (uterine monitoring) and your baby's heart rate (fetal monitoring). You may need to be monitored:  Often, but not continuously (intermittently).  All the time or for long periods at a time (continuously). Continuous monitoring may be needed if: ? You are taking certain medicines, such  as medicine to relieve pain or make your contractions stronger. ? You have pregnancy or labor complications. Monitoring may be done by:  Placing a special stethoscope or a handheld monitoring device on your abdomen to check your baby's heartbeat and to check for contractions.  Placing monitors on your abdomen (external monitors) to record your baby's heartbeat and the frequency and length of contractions.  Placing monitors inside your uterus through your vagina (internal monitors) to record your baby's heartbeat and the frequency, length, and strength of your contractions. Depending on the type of monitor, it may remain in your uterus or on your baby's head until birth.  Telemetry. This is a type of continuous monitoring that can be done with external or internal monitors. Instead of having to stay in bed, you are able to move around during telemetry. Physical exam Your health  care provider may perform frequent physical exams. This may include:  Checking how and where your baby is positioned in your uterus.  Checking your cervix to determine: ? Whether it is thinning out (effacing). ? Whether it is opening up (dilating). What happens during labor and delivery?  Normal labor and delivery is divided into the following three stages: Stage 1  This is the longest stage of labor.  This stage can last for hours or days.  Throughout this stage, you will feel contractions. Contractions generally feel mild, infrequent, and irregular at first. They get stronger, more frequent (about every 2-3 minutes), and more regular as you move through this stage.  This stage ends when your cervix is completely dilated to 4 inches (10 cm) and completely effaced. Stage 2  This stage starts once your cervix is completely effaced and dilated and lasts until the delivery of your baby.  This stage may last from 20 minutes to 2 hours.  This is the stage where you will feel an urge to push your baby out of your vagina.  You may feel stretching and burning pain, especially when the widest part of your baby's head passes through the vaginal opening (crowning).  Once your baby is delivered, the umbilical cord will be clamped and cut. This usually occurs after waiting a period of 1-2 minutes after delivery.  Your baby will be placed on your bare chest (skin-to-skin contact) in an upright position and covered with a warm blanket. Watch your baby for feeding cues, like rooting or sucking, and help the baby to your breast for his or her first feeding. Stage 3  This stage starts immediately after the birth of your baby and ends after you deliver the placenta.  This stage may take anywhere from 5 to 30 minutes.  After your baby has been delivered, you will feel contractions as your body expels the placenta and your uterus contracts to control bleeding. What can I expect after labor and  delivery?  After labor is over, you and your baby will be monitored closely until you are ready to go home to ensure that you are both healthy. Your health care team will teach you how to care for yourself and your baby.  You and your baby will stay in the same room (rooming in) during your hospital stay. This will encourage early bonding and successful breastfeeding.  You may continue to receive fluids and medicines through an IV.  Your uterus will be checked and massaged regularly (fundal massage).  You will have some soreness and pain in your abdomen, vagina, and the area of skin between your vaginal opening  and your anus (perineum).  If an incision was made near your vagina (episiotomy) or if you had some vaginal tearing during delivery, cold compresses may be placed on your episiotomy or your tear. This helps to reduce pain and swelling.  You may be given a squirt bottle to use instead of wiping when you go to the bathroom. To use the squirt bottle, follow these steps: ? Before you urinate, fill the squirt bottle with warm water. Do not use hot water. ? After you urinate, while you are sitting on the toilet, use the squirt bottle to rinse the area around your urethra and vaginal opening. This rinses away any urine and blood. ? Fill the squirt bottle with clean water every time you use the bathroom.  It is normal to have vaginal bleeding after delivery. Wear a sanitary pad for vaginal bleeding and discharge. Summary  Vaginal delivery means that you will give birth by pushing your baby out of your birth canal (vagina).  Your health care provider may monitor your contractions (uterine monitoring) and your baby's heart rate (fetal monitoring).  Your health care provider may perform a physical exam.  Normal labor and delivery is divided into three stages.  After labor is over, you and your baby will be monitored closely until you are ready to go home. This information is not intended  to replace advice given to you by your health care provider. Make sure you discuss any questions you have with your health care provider. Document Revised: 01/28/2017 Document Reviewed: 01/28/2017 Elsevier Patient Education  2020 [employer].

## 2019-05-18 ENCOUNTER — Other Ambulatory Visit: Payer: Self-pay

## 2019-05-18 ENCOUNTER — Inpatient Hospital Stay (HOSPITAL_COMMUNITY)
Admission: AD | Admit: 2019-05-18 | Discharge: 2019-05-20 | DRG: 806 | Disposition: A | Discharge status: Home / Self Care | Payer: 59 | Attending: Obstetrics & Gynecology | Admitting: Obstetrics & Gynecology

## 2019-05-18 ENCOUNTER — Inpatient Hospital Stay (HOSPITAL_COMMUNITY): Payer: 59 | Admitting: Anesthesiology

## 2019-05-18 ENCOUNTER — Encounter (HOSPITAL_COMMUNITY): Payer: Self-pay | Admitting: Obstetrics & Gynecology

## 2019-05-18 DIAGNOSIS — E669 Obesity, unspecified: Secondary | ICD-10-CM | POA: Diagnosis present

## 2019-05-18 DIAGNOSIS — Z3A38 38 weeks gestation of pregnancy: Secondary | ICD-10-CM

## 2019-05-18 DIAGNOSIS — R42 Dizziness and giddiness: Secondary | ICD-10-CM | POA: Diagnosis not present

## 2019-05-18 DIAGNOSIS — O2693 Pregnancy related conditions, unspecified, third trimester: Secondary | ICD-10-CM | POA: Diagnosis not present

## 2019-05-18 DIAGNOSIS — O24420 Gestational diabetes mellitus in childbirth, diet controlled: Secondary | ICD-10-CM | POA: Diagnosis present

## 2019-05-18 DIAGNOSIS — Z20822 Contact with and (suspected) exposure to covid-19: Secondary | ICD-10-CM | POA: Diagnosis present

## 2019-05-18 DIAGNOSIS — O99214 Obesity complicating childbirth: Secondary | ICD-10-CM | POA: Diagnosis present

## 2019-05-18 DIAGNOSIS — R519 Headache, unspecified: Secondary | ICD-10-CM | POA: Diagnosis not present

## 2019-05-18 DIAGNOSIS — D6859 Other primary thrombophilia: Secondary | ICD-10-CM | POA: Diagnosis present

## 2019-05-18 DIAGNOSIS — O99344 Other mental disorders complicating childbirth: Secondary | ICD-10-CM | POA: Diagnosis present

## 2019-05-18 DIAGNOSIS — F411 Generalized anxiety disorder: Secondary | ICD-10-CM | POA: Diagnosis present

## 2019-05-18 DIAGNOSIS — O9912 Other diseases of the blood and blood-forming organs and certain disorders involving the immune mechanism complicating childbirth: Principal | ICD-10-CM | POA: Diagnosis present

## 2019-05-18 DIAGNOSIS — O26893 Other specified pregnancy related conditions, third trimester: Secondary | ICD-10-CM | POA: Diagnosis present

## 2019-05-18 LAB — CBC
HCT: 38.9 % (ref 36.0–46.0)
Hemoglobin: 12.2 g/dL (ref 12.0–15.0)
MCH: 25.8 pg — ABNORMAL LOW (ref 26.0–34.0)
MCHC: 31.4 g/dL (ref 30.0–36.0)
MCV: 82.4 fL (ref 80.0–100.0)
Platelets: 211 10*3/uL (ref 150–400)
RBC: 4.72 MIL/uL (ref 3.87–5.11)
RDW: 17.3 % — ABNORMAL HIGH (ref 11.5–15.5)
WBC: 11.3 10*3/uL — ABNORMAL HIGH (ref 4.0–10.5)
nRBC: 0 % (ref 0.0–0.2)

## 2019-05-18 LAB — TYPE AND SCREEN
ABO/RH(D): AB POS
Antibody Screen: NEGATIVE

## 2019-05-18 LAB — HEPARIN ANTI-XA: Heparin LMW: <0.1 IU/mL

## 2019-05-18 LAB — GLUCOSE, CAPILLARY: Glucose-Capillary: 78 mg/dL (ref 70–99)

## 2019-05-18 LAB — ABO/RH: ABO/RH(D): AB POS

## 2019-05-18 MED ORDER — LACTATED RINGERS IV SOLN: INTRAVENOUS | Status: DC

## 2019-05-18 MED ORDER — SIMETHICONE 80 MG PO CHEW: 80.0000 mg | CHEWABLE_TABLET | ORAL | Status: DC | PRN

## 2019-05-18 MED ORDER — ONDANSETRON HCL 4 MG/2ML IJ SOLN: 4.0000 mg | INTRAMUSCULAR | Status: DC | PRN

## 2019-05-18 MED ORDER — ENOXAPARIN SODIUM 120 MG/0.8ML ~~LOC~~ SOLN
120.0000 mg | Freq: Two times a day (BID) | SUBCUTANEOUS | Status: DC
  Administered 2019-05-19 – 2019-05-20 (×3): 120 mg via SUBCUTANEOUS
  Filled 2019-05-18 (×4): qty 0.8

## 2019-05-18 MED ORDER — COCONUT OIL OIL: 1.0000 "application " | TOPICAL_OIL | Status: DC | PRN

## 2019-05-18 MED ORDER — ACETAMINOPHEN 325 MG PO TABS
650.0000 mg | ORAL_TABLET | ORAL | Status: DC | PRN
  Administered 2019-05-18 – 2019-05-20 (×6): 650 mg via ORAL
  Filled 2019-05-18 (×7): qty 2

## 2019-05-18 MED ORDER — TETANUS-DIPHTH-ACELL PERTUSSIS 5-2.5-18.5 LF-MCG/0.5 IM SUSP: 0.5000 mL | Freq: Once | INTRAMUSCULAR | Status: DC

## 2019-05-18 MED ORDER — PHENYLEPHRINE 40 MCG/ML (10ML) SYRINGE FOR IV PUSH (FOR BLOOD PRESSURE SUPPORT): 80.0000 ug | PREFILLED_SYRINGE | INTRAVENOUS | Status: DC | PRN

## 2019-05-18 MED ORDER — ONDANSETRON HCL 4 MG/2ML IJ SOLN: 4.0000 mg | Freq: Four times a day (QID) | INTRAMUSCULAR | Status: DC | PRN

## 2019-05-18 MED ORDER — FENTANYL CITRATE (PF) 100 MCG/2ML IJ SOLN: 50.0000 ug | INTRAMUSCULAR | Status: DC | PRN

## 2019-05-18 MED ORDER — SODIUM CHLORIDE (PF) 0.9 % IJ SOLN
INTRAMUSCULAR | Status: DC | PRN
  Administered 2019-05-18: 11 mL/h via EPIDURAL

## 2019-05-18 MED ORDER — LIDOCAINE HCL (PF) 1 % IJ SOLN: 30.0000 mL | INTRAMUSCULAR | Status: DC | PRN

## 2019-05-18 MED ORDER — PRENATAL MULTIVITAMIN CH
1.0000 | ORAL_TABLET | Freq: Every day | ORAL | Status: DC
  Administered 2019-05-20: 1 via ORAL
  Filled 2019-05-18 (×3): qty 1

## 2019-05-18 MED ORDER — ONDANSETRON HCL 4 MG PO TABS: 4.0000 mg | ORAL_TABLET | ORAL | Status: DC | PRN

## 2019-05-18 MED ORDER — OXYCODONE-ACETAMINOPHEN 5-325 MG PO TABS: 1.0000 | ORAL_TABLET | ORAL | Status: DC | PRN

## 2019-05-18 MED ORDER — OXYCODONE-ACETAMINOPHEN 5-325 MG PO TABS: 2.0000 | ORAL_TABLET | ORAL | Status: DC | PRN

## 2019-05-18 MED ORDER — EPHEDRINE 5 MG/ML INJ: 10.0000 mg | INTRAVENOUS | Status: DC | PRN

## 2019-05-18 MED ORDER — FENTANYL-BUPIVACAINE-NACL 0.5-0.125-0.9 MG/250ML-% EP SOLN
12.0000 mL/h | EPIDURAL | Status: DC | PRN
  Filled 2019-05-18: qty 250

## 2019-05-18 MED ORDER — ACETAMINOPHEN 325 MG PO TABS: 650.0000 mg | ORAL_TABLET | ORAL | Status: DC | PRN

## 2019-05-18 MED ORDER — OXYTOCIN BOLUS FROM INFUSION
500.0000 mL | Freq: Once | INTRAVENOUS | Status: AC
  Administered 2019-05-18: 21:00:00 500 mL via INTRAVENOUS

## 2019-05-18 MED ORDER — TERBUTALINE SULFATE 1 MG/ML IJ SOLN: 0.2500 mg | Freq: Once | INTRAMUSCULAR | Status: DC | PRN

## 2019-05-18 MED ORDER — SOD CITRATE-CITRIC ACID 500-334 MG/5ML PO SOLN: 30.0000 mL | ORAL | Status: DC | PRN

## 2019-05-18 MED ORDER — SENNOSIDES-DOCUSATE SODIUM 8.6-50 MG PO TABS
2.0000 | ORAL_TABLET | ORAL | Status: DC
  Administered 2019-05-18 – 2019-05-20 (×2): 2 via ORAL
  Filled 2019-05-18 (×2): qty 2

## 2019-05-18 MED ORDER — OXYTOCIN 40 UNITS IN NORMAL SALINE INFUSION - SIMPLE MED
2.5000 [IU]/h | INTRAVENOUS | Status: DC
  Administered 2019-05-18: 22:00:00 2.5 [IU]/h via INTRAVENOUS

## 2019-05-18 MED ORDER — DIBUCAINE (PERIANAL) 1 % EX OINT: 1.0000 "application " | TOPICAL_OINTMENT | CUTANEOUS | Status: DC | PRN

## 2019-05-18 MED ORDER — DIPHENHYDRAMINE HCL 50 MG/ML IJ SOLN: 12.5000 mg | INTRAMUSCULAR | Status: DC | PRN

## 2019-05-18 MED ORDER — LACTATED RINGERS IV SOLN: 500.0000 mL | INTRAVENOUS | Status: DC | PRN

## 2019-05-18 MED ORDER — LIDOCAINE HCL (PF) 1 % IJ SOLN
INTRAMUSCULAR | Status: DC | PRN
  Administered 2019-05-18 (×2): 4 mL via EPIDURAL

## 2019-05-18 MED ORDER — MISOPROSTOL 200 MCG PO TABS
ORAL_TABLET | ORAL | Status: AC
  Filled 2019-05-18: qty 5

## 2019-05-18 MED ORDER — DIPHENHYDRAMINE HCL 25 MG PO CAPS: 25.0000 mg | ORAL_CAPSULE | Freq: Four times a day (QID) | ORAL | Status: DC | PRN

## 2019-05-18 MED ORDER — BENZOCAINE-MENTHOL 20-0.5 % EX AERO
1.0000 "application " | INHALATION_SPRAY | CUTANEOUS | Status: DC | PRN
  Administered 2019-05-18: 1 via TOPICAL
  Filled 2019-05-18: qty 56

## 2019-05-18 MED ORDER — LACTATED RINGERS IV SOLN: 500.0000 mL | Freq: Once | INTRAVENOUS | Status: DC

## 2019-05-18 MED ORDER — WITCH HAZEL-GLYCERIN EX PADS: 1.0000 "application " | MEDICATED_PAD | CUTANEOUS | Status: DC | PRN

## 2019-05-18 MED ORDER — OXYTOCIN 40 UNITS IN NORMAL SALINE INFUSION - SIMPLE MED
1.0000 m[IU]/min | INTRAVENOUS | Status: DC
  Administered 2019-05-18: 15:00:00 1 m[IU]/min via INTRAVENOUS
  Filled 2019-05-18: qty 1000

## 2019-05-18 NOTE — Anesthesia Procedure Notes (Signed)
Epidural Patient location during procedure: OB Start time: 05/18/2019 3:29 PM End time: 05/18/2019 3:38 PM  Staffing Anesthesiologist: Mal Amabile, MD Performed: anesthesiologist   Preanesthetic Checklist Completed: patient identified, IV checked, site marked, risks and benefits discussed, surgical consent, monitors and equipment checked, pre-op evaluation and timeout performed  Epidural Patient position: sitting Prep: DuraPrep and site prepped and draped Patient monitoring: continuous pulse ox and blood pressure Approach: midline Location: L3-L4 Injection technique: LOR air  Needle:  Needle type: Tuohy  Needle gauge: 17 G Needle length: 9 cm and 9 Needle insertion depth: 5 cm cm Catheter type: closed end flexible Catheter size: 19 Gauge Catheter at skin depth: 10 cm Test dose: negative and Other  Assessment Events: blood not aspirated, injection not painful, no injection resistance, no paresthesia and negative IV test  Additional Notes Patient identified. Risks and benefits discussed including failed block, incomplete  Pain control, post dural puncture headache, nerve damage, paralysis, blood pressure Changes, nausea, vomiting, reactions to medications-both toxic and allergic and post Partum back pain. All questions were answered. Patient expressed understanding and wished to proceed. Sterile technique was used throughout procedure. Epidural site was Dressed with sterile barrier dressing. No paresthesias, signs of intravascular injection Or signs of intrathecal spread were encountered.  Patient was more comfortable after the epidural was dosed. Please see RN's note for documentation of vital signs and FHR which are stable. Reason for block:procedure for pain

## 2019-05-18 NOTE — H&P (Signed)
Kimberly Parks is a 34 y.o. female, G2P1001 at [redacted]w[redacted]d admitted for labor.  Notes regular contractions since early this am.  No LOF, no vaginal bleeding. +FM. Prenatal care has been provided by Dr. Nelda Marseille  ROS: no HA, no epigastric pain, no visual changes.    Pregnancy complicated by: 1) anti-thrombin-3 def. With prior thromboembolic event Followed by anti-xa levels -currently on Lovenox 130mg  bid  2) GDMA1- well controlled by diet -Last [place]- 5/10- vertex/post/EFW: 7#10oz (77%)  3) Anxiety- on zoloft 100mg  daily   Prenatal Transfer Tool  Maternal Diabetes: Yes:  Diabetes Type:  Diet controlled Genetic Screening: Normal Maternal Ultrasounds/Referrals: Normal Fetal Ultrasounds or other Referrals:  Referred to Materal Fetal Medicine  Maternal Substance Abuse:  No Significant Maternal Medications:  Meds include: Zoloft Other:  Significant Maternal Lab Results: Group B Strep negative     OB History    Gravida  2   Para  1   Term  1   Preterm      AB      Living  1     SAB      TAB      Ectopic      Multiple      Live Births  1          Past Medical History:  Diagnosis Date  . Anxiety   . Cerebral venous thrombosis   . Chest pain   . Coagulopathy ([place])    borderline ATIII def. with h/o sagital sinus thrombosis   . Gestational diabetes    Past Surgical History:  Procedure Laterality Date  . NO PAST SURGERIES     Family History: family history includes Bladder Cancer in her maternal grandfather; COPD in her paternal grandmother; Diabetes in her father and paternal grandmother; Heart disease in her father and paternal grandmother; Hyperlipidemia in her mother; Hypertension in her mother and paternal grandmother; Thyroid disease in her mother; Vision loss in her maternal grandfather. Social History:  reports that she has never smoked. She has never used smokeless tobacco. She reports previous alcohol use. She reports that she does not use drugs.   PNL:  GBS  negative, Rub Immune, Hep B neg, RPR NR, HIV neg, GC/C neg, glucola:abnormal Hgb: 11 Blood type: AB positive  Immunizations: Tdap: 2/26 Flu: 11/26/18  OBHx: @ 37wk- NSVD, female PMHx:  Anti-thrombin 3 def with thromboembolic event, generalized anxiety disorder (GAD) Meds:  PNV, Lovenox, zoloft Allergy:  No Known Allergies SurgHx: none SocHx:   no Tobacco, no  EtOH, no Illicit Drugs  O: BP 254/27   Pulse 82   Temp 98.2 F (36.8 C) (Oral)   Resp 18   Ht 5\' 2"  (1.575 m)   Wt 86.2 kg   LMP 08/19/2018 (Exact Date)   SpO2 98%   BMI 34.75 kg/m  Gen. AAOx3, NAD CV.  RRR Resp. Normal respiratory rate and effort Abd. Gravid,  no tenderness,  no rigidity,  no guarding Extr.  1+ edema  FHT: 130 baseline, moderate variability, + accels,  no decels Toco: irregular SVE: deferred last exam- 4cm   Labs:  Results for orders placed or performed during the hospital encounter of 05/18/19 (from the past 24 hour(s))  Type and screen [place]     Status: None (Preliminary result)   Collection Time: 05/18/19 11:49 AM  Result Value Ref Range   ABO/RH(D) PENDING    Antibody Screen PENDING    Sample Expiration      05/21/2019,2359 Performed  at [facility], [address] [place], [place] [address]   CBC     Status: Abnormal   Collection Time: 05/18/19 11:54 AM  Result Value Ref Range   WBC 11.3 (H) 4.0 - 10.5 K/uL   RBC 4.72 3.87 - 5.11 MIL/uL   Hemoglobin 12.2 12.0 - 15.0 g/dL   HCT 98.2 64.1 - 58.3 %   MCV 82.4 80.0 - 100.0 fL   MCH 25.8 (L) 26.0 - 34.0 pg   MCHC 31.4 30.0 - 36.0 g/dL   RDW 09.4 (H) 07.6 - 80.8 %   Platelets 211 150 - 400 K/uL   nRBC 0.0 0.0 - 0.2 %  Glucose, capillary     Status: None   Collection Time: 05/18/19 12:20 PM  Result Value Ref Range   Glucose-Capillary 78 70 - 99 mg/dL     A/P:  34 y.o. [id number] @ [redacted]w[redacted]d EGA who presents for labor -FWB:  NICHD Cat I FHTs -Labor: expectant management- if no further change plan for Pitocin  or AROM -GBS: negative -GDMA1- accucheck within normal limits, no scheduled testing at this time -Anti-thrombin 3- notified anesthesia.  Last Lovenox was yesterday evening.  Plan to check anti-Xa level prior to epidural  Myna Hidalgo, DO [phone] (cell) [phone] (office)

## 2019-05-18 NOTE — MAU Note (Signed)
Presents with regular ctxs since 0400 this morning, states ctxs are every 20 minutes.  Denies VB or LOF.  Endorses +FM.

## 2019-05-18 NOTE — Anesthesia Preprocedure Evaluation (Addendum)
Anesthesia Evaluation  Patient identified by MRN, date of birth, ID band Patient awake    Reviewed: Allergy & Precautions, Patient's Chart, lab work & pertinent test results  Airway Mallampati: II  TM Distance: >3 FB Neck ROM: Full    Dental no notable dental hx. (+) Teeth Intact   Pulmonary neg pulmonary ROS,    Pulmonary exam normal breath sounds clear to auscultation       Cardiovascular negative cardio ROS Normal cardiovascular exam Rhythm:Regular Rate:Normal  Hx/o Central Venous sinus thrombosis   Neuro/Psych Anxiety negative neurological ROS     GI/Hepatic   Endo/Other  diabetes, Well Controlled, GestationalObesity  Renal/GU   negative genitourinary   Musculoskeletal negative musculoskeletal ROS (+)   Abdominal (+) + obese,   Peds  Hematology Anti thrombin III deficiency Anticoagulated with Lovenox 120mg  BID- last dose 1045 pm 05/17/19   Anesthesia Other Findings   Reproductive/Obstetrics (+) Pregnancy                            Anesthesia Physical Anesthesia Plan  ASA: II  Anesthesia Plan: Epidural   Post-op Pain Management:    Induction:   PONV Risk Score and Plan:   Airway Management Planned: Natural Airway  Additional Equipment:   Intra-op Plan:   Post-operative Plan:   Informed Consent: I have reviewed the patients History and Physical, chart, labs and discussed the procedure including the risks, benefits and alternatives for the proposed anesthesia with the patient or authorized representative who has indicated his/her understanding and acceptance.       Plan Discussed with: Anesthesiologist  Anesthesia Plan Comments: (I had a long discussion with the patient and her husband regarding epidural placement and removal in the setting of anticoagulation. The risks of bleeding int the back, possibility of permanent nerve damage vs the risks of formation of blood  clots and hemorrhage. She understands the risks, benefits and alternatives of a lumbar epidural placed for labor pain.)        Anesthesia Quick Evaluation

## 2019-05-18 NOTE — MAU Provider Note (Signed)
None      S: Kimberly Parks is a 34 y.o. G2P1001 at [redacted]w[redacted]d  who presents to MAU today complaining contractions since 0400. She denies vaginal bleeding. She denies LOF. She reports normal fetal movement.    O: BP 132/82 (BP Location: Right Arm)   Pulse 90   Temp 98 F (36.7 C) (Oral)   Resp 18   Ht 5\' 2"  (1.575 m)   Wt 86.2 kg   LMP 08/19/2018 (Exact Date)   SpO2 98%   BMI 34.75 kg/m  GENERAL: Well-developed, well-nourished female in no acute distress.  HEAD: Normocephalic, atraumatic.  CHEST: Normal effort of breathing, regular heart rate ABDOMEN: Soft, nontender, gravid  Cervical exam:  Dilation: 4 Effacement (%): 70 Cervical Position: Posterior Station: -2 Presentation: Vertex Exam by:: F. Morris, RNC   Fetal Monitoring: Baseline: 130 Variability: Mod Accelerations: POS 15 x 15 Decelerations: None Contractions: Regular q 2-4   A: SIUP at 107w6d  Cervical change from 3-4cm in about 90 minutes Cat I tracing Report called to Dr. [redacted]w[redacted]d  P: Per Dr. Charlotta Newton, admit to L&D  Charlotta Newton, CNM 05/18/2019 12:02 PM

## 2019-05-18 NOTE — Progress Notes (Signed)
OB PN:  S: Pt resting comfortably s/p epidrual  no acute complaints  O: BP 118/78   Pulse 92   Temp 98.1 F (36.7 C) (Oral)   Resp 18   Ht 5\' 2"  (1.575 m)   Wt 86.2 kg   LMP 08/19/2018 (Exact Date)   SpO2 99%   BMI 34.75 kg/m   FHT: 120bpm, moderate variablity, + accels, no decels Toco: q53min SVE: 6/90/-1, AROM clear/blood-tinged fluid  A/P: 34 y.o. G2P1001 @ [redacted]w[redacted]d for labor 1. FWB: Cat. I 2. Labor: currently on Pit Pain: continue epidural GBS: negative 3. Anti-thrombin 3 def. -plan to restart Lovenox 4hr after delivery/catheter removal -continue SCDs  [redacted]w[redacted]d, DO [phone] (cell) [phone] (office)

## 2019-05-19 ENCOUNTER — Inpatient Hospital Stay (HOSPITAL_COMMUNITY): Payer: 59

## 2019-05-19 ENCOUNTER — Inpatient Hospital Stay (HOSPITAL_COMMUNITY): Admission: AD | Admit: 2019-05-19 | Payer: 59 | Source: Home / Self Care | Admitting: Obstetrics & Gynecology

## 2019-05-19 LAB — CBC
HCT: 36.7 % (ref 36.0–46.0)
Hemoglobin: 11.5 g/dL — ABNORMAL LOW (ref 12.0–15.0)
MCH: 26 pg (ref 26.0–34.0)
MCHC: 31.3 g/dL (ref 30.0–36.0)
MCV: 82.8 fL (ref 80.0–100.0)
Platelets: 205 10*3/uL (ref 150–400)
RBC: 4.43 MIL/uL (ref 3.87–5.11)
RDW: 17.4 % — ABNORMAL HIGH (ref 11.5–15.5)
WBC: 18.7 10*3/uL — ABNORMAL HIGH (ref 4.0–10.5)
nRBC: 0 % (ref 0.0–0.2)

## 2019-05-19 LAB — GLUCOSE, RANDOM: Glucose, Bld: 81 mg/dL (ref 70–99)

## 2019-05-19 LAB — GLUCOSE, CAPILLARY
Glucose-Capillary: 69 mg/dL — ABNORMAL LOW (ref 70–99)
Glucose-Capillary: 76 mg/dL (ref 70–99)

## 2019-05-19 LAB — RPR: RPR Ser Ql: NONREACTIVE

## 2019-05-19 MED ORDER — IBUPROFEN 600 MG PO TABS
600.0000 mg | ORAL_TABLET | Freq: Four times a day (QID) | ORAL | Status: DC | PRN
  Administered 2019-05-19 – 2019-05-20 (×5): 600 mg via ORAL
  Filled 2019-05-19 (×5): qty 1

## 2019-05-19 MED ORDER — OXYCODONE HCL 5 MG PO TABS: 5.0000 mg | ORAL_TABLET | Freq: Four times a day (QID) | ORAL | Status: DC | PRN

## 2019-05-19 NOTE — Lactation Note (Signed)
This note was copied from a baby's chart. Lactation Consultation Note  Patient Name: Kimberly Parks [id number] Date: 05/19/2019 Reason for consult: Follow-up assessment;Difficult latch Mom called out for latch assist.  Baby is in football hold and rooting.  Attempted latch several times but baby could not grasp tissue.  24 mm nipple shield applied and baby sucked off and on for 5 minutes and then fell asleep.  Nipple shield filled with formula and baby would suck until milk gone.  Reassured mom and encouraged to continue to put baby to breast with cues.  Stressed importance of pumping every 3 hours.  Parents will supplement baby with 10-15 mls of formula using a slow flow nipple.  Encouraged to call for latch assist prn.  Maternal Data    Feeding Feeding Type: Breast Fed  LATCH Score Latch: Repeated attempts needed to sustain latch, nipple held in mouth throughout feeding, stimulation needed to elicit sucking reflex.  Audible Swallowing: A few with stimulation  Type of Nipple: Everted at rest and after stimulation  Comfort (Breast/Nipple): Soft / non-tender  Hold (Positioning): Assistance needed to correctly position infant at breast and maintain latch.  LATCH Score: 7  Interventions Interventions: Assisted with latch;Breast compression;Adjust position;Skin to skin;Support pillows;Hand express  Lactation Tools Discussed/Used Tools: Nipple Shields Nipple shield size: 24   Consult Status Consult Status: Follow-up Date: 05/20/19 Follow-up type: In-patient    Huston Foley 05/19/2019, 3:09 PM

## 2019-05-19 NOTE — Lactation Note (Signed)
This note was copied from a baby's chart. Lactation Consultation Note Baby is 3 hrs old wanting to BF. Baby popping off and on frequently. Mom's soft breast and semi flat nipples making it very difficult to BF. Mom exclusively pumped and bottle fed. Mom did try to latch baby to the breast, needing NS. Mom didn't like it so she pumped and bottle fed.  Mom trying to latch baby to breast baby can't feel nipple in mouth. Breast very compressible. Baby off and on getting very frustrated. Mom wanted to try NS. Baby gets on suckles then pops off. Mom having a hard time firming breast.  LC placed a dry wash rolled cloth under breast. That helped a lot. Stressed importance of right now needing to firm breast to have control over breast to latch.  Can hand express colostrum. Hand pump given for pre-pumping. Shells given to wear to assist in everting nipples. Mom shown how to use DEBP & how to disassemble, clean, & reassemble parts. Mom knows to pump q3h for 15-20 min. Mom encouraged to feed baby 8-12 times/24 hours and with feeding cues.   Fitted mom w/#20 NS. Baby does have recessed chin, needs lips flanged after getting onto NS. Mom seems to have a lot of pt. W/baby.  Newborn feeding habits, STS, I&O, milk storage, breast compressions, assessing breast, positioning, support, comfort, safety discussed.  Encouraged mom to call if needs assistance. Lactation brochure given.  Patient Name: Kimberly Parks IEPPI'R Date: 05/19/2019 Reason for consult: Initial assessment;Early term 37-38.6wks;Maternal endocrine disorder Type of Endocrine Disorder?: Diabetes   Maternal Data Has patient been taught Hand Expression?: Yes Does the patient have breastfeeding experience prior to this delivery?: Yes  Feeding Feeding Type: Breast Fed  LATCH Score Latch: Repeated attempts needed to sustain latch, nipple held in mouth throughout feeding, stimulation needed to elicit sucking reflex.  Audible  Swallowing: None  Type of Nipple: Everted at rest and after stimulation(semi flat, very short shaft)  Comfort (Breast/Nipple): Soft / non-tender  Hold (Positioning): Assistance needed to correctly position infant at breast and maintain latch.  LATCH Score: 6  Interventions Interventions: Breast feeding basics reviewed;Support pillows;Assisted with latch;Position options;Skin to skin;Breast massage;Hand express;Shells;Pre-pump if needed;Hand pump;Breast compression;DEBP;Adjust position  Lactation Tools Discussed/Used Tools: Shells;Pump;Nipple Shields Nipple shield size: 20 Shell Type: Inverted Breast pump type: Double-Electric Breast Pump;Manual WIC Program: No Pump Review: Setup, frequency, and cleaning;Milk Storage Initiated by:: Peri Jefferson RN IBCLC Date initiated:: 05/19/19   Consult Status Consult Status: Follow-up Date: 05/19/19 Follow-up type: In-patient    Charyl Dancer 05/19/2019, 12:11 AM

## 2019-05-19 NOTE — Anesthesia Postprocedure Evaluation (Signed)
Anesthesia Post Note  Patient: Dot Splinter  Procedure(s) Performed: AN AD HOC LABOR EPIDURAL     Patient location during evaluation: Mother Baby Anesthesia Type: Epidural Level of consciousness: awake, awake and alert and oriented Pain management: pain level not controlled (Patient would like better pain control for perineal area. I clalled RN to ask her to call OB doctor for panother pain medication to add to tylenol.Marland Kitchen) Vital Signs Assessment: post-procedure vital signs reviewed and stable Respiratory status: spontaneous breathing, nonlabored ventilation and respiratory function stable Cardiovascular status: stable Postop Assessment: no headache, patient able to bend at knees, no apparent nausea or vomiting, adequate PO intake, able to ambulate and no backache Anesthetic complications: no    Last Vitals:  Vitals:   05/19/19 0525 05/19/19 0820  BP: 116/67 117/73  Pulse: 85 72  Resp: 16 18  Temp: 37.2 C 36.9 C  SpO2: 100%     Last Pain:  Vitals:   05/19/19 0820  TempSrc: Oral  PainSc:    Pain Goal:                   Arvetta Araque

## 2019-05-19 NOTE — Lactation Note (Signed)
This note was copied from a baby's chart. Lactation Consultation Note  Patient Name: Kimberly Parks GYJEH'U Date: 05/19/2019 Reason for consult: Follow-up assessment;Difficult latch;Early term 22-38.6wks Baby is 80 hours old.  Mom called out for latch assist.  Mom worried baby is not getting milk from breast so baby has been syringe fed once with formula. Mom used a nipple shield at last feeding. Discussed first 24 hours and reassured.  Parents were having difficulty with syringe feeding and prefer using a bottle.  Slow flow nipples provided.  Baby is currently sleeping on mom's chest.  Mom would like to attempt to latch baby.  Baby positioned in football hold.  Nipple is erect but short shafted.  Hand expression done and large drops of colostrum expressed into baby's mouth.  Suck training done on gloved finger and baby has a strong suck.  With good breast compression baby latched for 5-6 sucks and then fell asleep.  Waking techniques done but baby showing no interest in feeding.  Instructed mom to pump every 3 hours and call out for assist when baby starts to show feeding cues.  Maternal Data    Feeding Feeding Type: Breast Fed  LATCH Score Latch: Repeated attempts needed to sustain latch, nipple held in mouth throughout feeding, stimulation needed to elicit sucking reflex.  Audible Swallowing: None  Type of Nipple: Everted at rest and after stimulation  Comfort (Breast/Nipple): Soft / non-tender  Hold (Positioning): Assistance needed to correctly position infant at breast and maintain latch.  LATCH Score: 6  Interventions Interventions: Assisted with latch;Breast compression;Adjust position;Skin to skin;Support pillows;Hand express  Lactation Tools Discussed/Used     Consult Status Consult Status: Follow-up Date: 05/20/19 Follow-up type: In-patient    Huston Foley 05/19/2019, 1:51 PM

## 2019-05-19 NOTE — Progress Notes (Signed)
CSW received consult for hx of Anxiety and Post Partum Depression.  CSW met with MOB to offer support and complete assessment.    CSW congratulated MOB and FOB on the birth of infant. CSW advised MOB of CSW's role and the reason for CSW coming to visit with her. MOB reported that she was diagnosed with anxiety in 2018. MOB verbalized that she feels that she has had anxiety all of her life, however. MOB reported that she was taking Zoloft during her pregnancy and dealt with minimal anxiety. MOB reported that she was seeing a therapist in the past however no currently set up with therapist but does report that she has the ability to see therapist as needed. MOB informed CSW that she was never formally diagnosed with PPD but does recall that she dealt with feelings of nervousness, being overly protective of child, and "I didn't want anyone to touch him". CSW confirmed with MOB that all of these feelings can be associated with PPD. MOB reported that she has no other mental health hx and reports that she isn't feeling SI or HI.   MOB reported that she has all needed items to care for infant with no other needs at this time. MOB reported that infant will sleep in basinet once arrived home and will be [facility] at [facility] for follow up care.   CSW provided education regarding the baby blues period vs. perinatal mood disorders, discussed treatment and gave resources for mental health follow up if concerns arise.  CSW recommends self-evaluation during the postpartum time period using the New Mom Checklist from Postpartum Progress and encouraged MOB to contact a medical professional if symptoms are noted at any time.   CSW provided review of Sudden Infant Death Syndrome (SIDS) precautions.     CSW identifies no further need for intervention and no barriers to discharge at this time.   Virgie Dad Sherronda Sweigert, MSW, LCSW [facility] [phone]

## 2019-05-20 LAB — HEPARIN ANTI-XA: Heparin LMW: 0.61 IU/mL

## 2019-05-20 LAB — SURGICAL PATHOLOGY

## 2019-05-20 MED ORDER — ENOXAPARIN SODIUM 120 MG/0.8ML ~~LOC~~ SOLN: 120.0000 mg | Freq: Two times a day (BID) | SUBCUTANEOUS | 0 refills | Status: DC

## 2019-05-20 MED ORDER — IBUPROFEN 600 MG PO TABS: 600.0000 mg | ORAL_TABLET | Freq: Four times a day (QID) | ORAL | 0 refills | Status: AC | PRN

## 2019-05-20 NOTE — Discharge Instructions (Signed)

## 2019-05-20 NOTE — Lactation Note (Signed)
This note was copied from a baby's chart. Lactation Consultation Note  Patient Name: Kimberly Parks Date: 05/20/2019 Reason for consult: Follow-up assessment P2, 28 hour ETI female infant-1% weight loss. Mom uses 20 mm NS on her left breast only due to being flat. Mom is breastfeeding and then supplementing with formula after each feeding and using DEBP every 2 hours, per mom she is starting to see a small amount of  colostrum when pumping. Per mom, infant has started to cluster feed and she feels infant is latching better within the last 4 hours and sustaining latches now on her right breast, she is not using NS on her right breast and infant  breastfed for 15 minutes. LC entered room, infant was being assessed  by RN and infant  started cuing to breastfed. LC reviewed hand expression and  Infant was given 2 mls of colostrum by spoon.  LC ask mom to do breast stimulation prior to latching infant, mom latched infant on her left breast for 4 minutes without NS and infant briefly sustained latch, mom did use 20 mm NS after few minutes and with NS infant sustained latch and was still breastfeeding after 8 minutes when  LC left the room.  Mom will continue to attempt to latch infant on left breast without NS. Mom knows to call RN or LC if she has any questions, concerns or needs assistance with latching infant at breast. Mom will continue to breastfeed infant according to hunger cues, on demand . STS and not exceed 3 hours without breastfeeding infant. Maternal Data    Feeding Feeding Type: Breast Fed  LATCH Score Latch: Grasps breast easily, tongue down, lips flanged, rhythmical sucking.  Audible Swallowing: A few with stimulation  Type of Nipple: Flat  Comfort (Breast/Nipple): Soft / non-tender  Hold (Positioning): Assistance needed to correctly position infant at breast and maintain latch.  LATCH Score: 7  Interventions Interventions: Breast feeding basics  reviewed;Breast compression;Adjust position;Assisted with latch;Skin to skin;Support pillows;Position options;Breast massage;Expressed milk;Shells;Pre-pump if needed;Hand express;DEBP;Hand pump  Lactation Tools Discussed/Used Tools: Pump Nipple shield size: 20 Shell Type: (flat) Breast pump type: Double-Electric Breast Pump   Consult Status Consult Status: Follow-up Date: 05/20/19 Follow-up type: In-patient    Danelle Earthly 05/20/2019, 1:16 AM

## 2019-05-20 NOTE — Progress Notes (Signed)
Late entry for 05/19/2019  Postpartum Note Day # 1  S:  Patient resting comfortable in bed.  Pain controlled.  Tolerating genearl diet. No flatus, no BM.  Lochia moderate.  Ambulating without difficulty.  She denies n/v/f/c, SOB, or CP.  Pt plans on breastfeeding.  O: Temp:  [97.8 F (36.6 C)-98.4 F (36.9 C)] 97.8 F (36.6 C) (05/13 0554) Pulse Rate:  [72-91] 74 (05/13 0554) Resp:  [16-18] 16 (05/13 0554) BP: (117-138)/(72-78) 132/78 (05/13 0554) SpO2:  [99 %-100 %] 99 % (05/13 0554)   Gen: A&Ox3, NAD CV: RRR, no MRG Resp: CTAB Abdomen: soft, NT, ND Uterus: firm, non-tender, below umbilicus Ext: No edema, no calf tenderness bilaterally, SCDs in place  Labs:  Recent Labs    05/18/19 1154 05/19/19 0528  HGB 12.2 11.5*    A/P: Pt is a 34 y.o. [id number] s/p NSVD, PPD#1 -Anti-thrombin3 def.  On Lovenox 120mg  bid- plan to recheck level 5/13 in am - Pain well controlled -GU: voiding freely -GI: Tolerating general diet -Activity: encouraged sitting up to chair and ambulation as tolerated -Prophylaxis: early ambluation, SCDs, Lovenox -Labs: stable as above  Continue with routine postpartum care  6/13, DO [phone] (cell) [phone] (office)

## 2019-05-20 NOTE — Progress Notes (Signed)
Postpartum Note Day # 2  S:  Patient resting comfortable in bed.  Pain controlled.  Tolerating genearl diet. + flatus, + BM.  Lochia moderate.  Ambulating without difficulty.  She denies n/v/f/c, SOB, or CP.  Pt plans on breastfeeding.  O: Temp:  [97.8 F (36.6 C)-98.4 F (36.9 C)] 97.8 F (36.6 C) (05/13 0554) Pulse Rate:  [72-91] 74 (05/13 0554) Resp:  [16-18] 16 (05/13 0554) BP: (117-138)/(72-78) 132/78 (05/13 0554) SpO2:  [99 %-100 %] 99 % (05/13 0554)   Gen: A&Ox3, NAD CV: RRR Resp: CTAB Abdomen: soft, NT, ND Uterus: firm, non-tender, below umbilicus Ext: No edema, no calf tenderness bilaterally, SCDs in place  Labs:  Recent Labs    05/18/19 1154 05/19/19 0528  HGB 12.2 11.5*    A/P: Pt is a 34 y.o. [id number] s/p NSVD, PPD# -Anti-thrombin3 def.  On Lovenox 120mg  bid- pending level today may adjust - Pain well controlled -GU: voiding freely -GI: Tolerating general diet -Activity: encouraged sitting up to chair and ambulation as tolerated -Prophylaxis: early ambluation, SCDs, Lovenox -Labs: stable as above  Plan for discharge home later today  , DO [phone] (cell) [phone] (office)

## 2019-05-20 NOTE — Lactation Note (Signed)
This note was copied from a baby's chart. Lactation Consultation Note  Patient Name: Kimberly Parks [id number] Date: 05/20/2019 Reason for consult: Follow-up assessment Baby is 36 hours/4% weight loss.  Mom reports that baby is latching much easier without nipple shield.  Mom continues to post pump every 3 hours and obtains small amount of colostrum.  Mom has a pump at home.  Parents continue supplementing with formula until milk comes to volume.  Questions answered.  Reviewed outpatient services and encouraged to call prn.  Maternal Data    Feeding    LATCH Score                   Interventions    Lactation Tools Discussed/Used     Consult Status Consult Status: Complete Follow-up type: Call as needed    Huston Foley 05/20/2019, 9:27 AM

## 2019-05-24 NOTE — Discharge Summary (Signed)
Postpartum Discharge Summary    Patient Name: Kimberly Parks DOB: August 04, 1985 MRN: [id number]  Date of admission: 05/18/2019 Delivery date:05/18/2019  Delivering provider: Burman Foster B  Date of discharge: 05/20/2019  Admitting diagnosis: Labor and delivery, indication for care [O75.9] Intrauterine pregnancy: [redacted]w[redacted]d    Secondary diagnosis:  Active Problems:   Labor and delivery, indication for care  Additional problems: Anti-thrombin 3 Deficiency, GDMA1    Discharge diagnosis: Term Pregnancy Delivered                                              Post partum procedures:none Augmentation: AROM and Pitocin Complications: None  Hospital course: Onset of Labor With Vaginal Delivery      34y.o. yo [id number] admitted in Latent Labor on 05/18/2019. Patient had an uncomplicated labor course as follows:  Membrane Rupture Time/Date: 4:51 PM ,05/18/2019   Delivery Method:Vaginal, Spontaneous  Episiotomy: None  Lacerations:  2nd degree  Patient had an uncomplicated postpartum course.  She is ambulating, tolerating a regular diet, passing flatus, and urinating well. Patient is discharged home in stable condition on 05/24/19.  Newborn Data: Birth date:05/18/2019  Birth time:9:06 PM  Gender:Female  Living status:Living  Apgars:6 ,9  Weight:3379 g   Magnesium Sulfate received: No BMZ received: No Rhophylac:No MMR:No T-DaP:Given prenatally Flu: No Transfusion:No  Physical exam  Vitals:   05/19/19 0820 05/19/19 1605 05/19/19 2054 05/20/19 0554  BP: 117/73 138/72 130/77 132/78  Pulse: 72 85 91 74  Resp: '18 16 16 16  ' Temp: 98.4 F (36.9 C) 98.3 F (36.8 C) 98.3 F (36.8 C) 97.8 F (36.6 C)  TempSrc: Oral Oral Oral Oral  SpO2:  100% 100% 99%  Weight:      Height:       General: alert, cooperative and no distress Lochia: appropriate Uterine Fundus: firm Incision: N/A DVT Evaluation: No evidence of DVT seen on physical exam. Labs: Lab Results  Component Value  Date   WBC 18.7 (H) 05/19/2019   HGB 11.5 (L) 05/19/2019   HCT 36.7 05/19/2019   MCV 82.8 05/19/2019   PLT 205 05/19/2019   CMP Latest Ref Rng & Units 05/19/2019  Glucose 70 - 99 mg/dL 81  BUN 6 - 20 mg/dL -  Creatinine 0.44 - 1.00 mg/dL -  Sodium 135 - 145 mmol/L -  Potassium 3.5 - 5.1 mmol/L -  Chloride 98 - 111 mmol/L -  CO2 22 - 32 mmol/L -  Calcium 8.9 - 10.3 mg/dL -  Total Protein 6.5 - 8.1 g/dL -  Total Bilirubin 0.3 - 1.2 mg/dL -  Alkaline Phos 38 - 126 U/L -  AST 15 - 41 U/L -  ALT 0 - 44 U/L -   Edinburgh Score: Edinburgh Postnatal Depression Scale Screening Tool 05/19/2019  I have been able to laugh and see the funny side of things. 0  I have looked forward with enjoyment to things. 0  I have blamed myself unnecessarily when things went wrong. 1  I have been anxious or worried for no good reason. 1  I have felt scared or panicky for no good reason. 1  Things have been getting on top of me. 0  I have been so unhappy that I have had difficulty sleeping. 0  I have felt sad or miserable. 0  I have been so unhappy  that I have been crying. 0  The thought of harming myself has occurred to me. 0  Edinburgh Postnatal Depression Scale Total 3      After visit meds:  Allergies as of 05/20/2019   No Known Allergies     Medication List    STOP taking these medications   IRON PO     TAKE these medications   acetaminophen 325 MG tablet Commonly known as: TYLENOL Take 650 mg by mouth every 6 (six) hours as needed for mild pain or headache.   cetirizine 10 MG tablet Commonly known as: ZYRTEC Take 10 mg by mouth daily as needed for allergies.   enoxaparin 120 MG/0.8ML injection Commonly known as: LOVENOX Inject 0.8 mLs (120 mg total) into the skin 2 (two) times daily. What changed: Another medication with the same name was added. Make sure you understand how and when to take each.   enoxaparin 120 MG/0.8ML injection Commonly known as: LOVENOX Inject 0.8 mLs  (120 mg total) into the skin every 12 (twelve) hours. What changed: You were already taking a medication with the same name, and this prescription was added. Make sure you understand how and when to take each.   ibuprofen 600 MG tablet Commonly known as: ADVIL Take 1 tablet (600 mg total) by mouth every 6 (six) hours as needed for moderate pain.   omeprazole 20 MG capsule Commonly known as: PRILOSEC Take 20 mg by mouth 2 (two) times daily as needed (indigestion/heartburn).   PRENATAL VITAMIN PO Take 1 tablet by mouth daily.   sertraline 100 MG tablet Commonly known as: ZOLOFT Take 50 mg by mouth daily.   triamcinolone cream 0.1 % Commonly known as: KENALOG Apply 1 application topically 4 (four) times daily as needed (itching).        Discharge home in stable condition Infant Feeding: Breast Infant Disposition:home with mother Discharge instruction: per After Visit Summary and Postpartum booklet. Activity: Advance as tolerated. Pelvic rest for 6 weeks.  Diet: routine diet Anticipated Birth Control: IUD Postpartum Appointment:2 weeks Additional Postpartum F/U: none Future Appointments:No future appointments. Follow up Visit: Follow-up Information    Janyth Pupa, DO Follow up in 3 week(s).   Specialty: Obstetrics and Gynecology Contact information: [address] [place] [address] [phone]               05/24/2019 Annalee Genta, DO

## 2020-04-01 ENCOUNTER — Emergency Department (HOSPITAL_BASED_OUTPATIENT_CLINIC_OR_DEPARTMENT_OTHER): Payer: 59

## 2020-04-01 ENCOUNTER — Encounter (HOSPITAL_BASED_OUTPATIENT_CLINIC_OR_DEPARTMENT_OTHER): Payer: Self-pay | Admitting: *Deleted

## 2020-04-01 ENCOUNTER — Emergency Department (HOSPITAL_BASED_OUTPATIENT_CLINIC_OR_DEPARTMENT_OTHER)
Admission: EM | Admit: 2020-04-01 | Discharge: 2020-04-01 | Disposition: A | Discharge status: Home / Self Care | Payer: 59 | Attending: Emergency Medicine | Admitting: Emergency Medicine

## 2020-04-01 ENCOUNTER — Other Ambulatory Visit: Payer: Self-pay

## 2020-04-01 DIAGNOSIS — M62830 Muscle spasm of back: Secondary | ICD-10-CM | POA: Insufficient documentation

## 2020-04-01 DIAGNOSIS — R202 Paresthesia of skin: Secondary | ICD-10-CM | POA: Diagnosis not present

## 2020-04-01 DIAGNOSIS — M5489 Other dorsalgia: Secondary | ICD-10-CM

## 2020-04-01 DIAGNOSIS — M546 Pain in thoracic spine: Secondary | ICD-10-CM | POA: Diagnosis present

## 2020-04-01 LAB — PREGNANCY, URINE: Preg Test, Ur: NEGATIVE

## 2020-04-01 MED ORDER — LIDOCAINE 5 % EX PTCH: 1.0000 | MEDICATED_PATCH | CUTANEOUS | 0 refills | Status: DC

## 2020-04-01 MED ORDER — METHOCARBAMOL 500 MG PO TABS: 500.0000 mg | ORAL_TABLET | Freq: Two times a day (BID) | ORAL | 0 refills | Status: AC

## 2020-04-01 NOTE — ED Provider Notes (Signed)
[facility] EMERGENCY DEPARTMENT Provider Note   CSN: [id number] Arrival date & time: 04/01/20  1540     History Chief Complaint  Patient presents with  . Back Pain    Kimberly Parks is a 35 y.o. female history of Antithrombin III deficiency, cerebral venous thrombosis, not currently anticoagulated  Patient reports 1 week ago she got a new pillow that she will use she developed lower neck and upper back pain shortly after pain has been increasing throughout the week sharp midline constant worsened with movement no alleviating factors has attempted ibuprofen and Tylenol without improvement.  Pain does not radiate.  Pain is associated with paresthesias of the bilateral upper extremities that occurred once in the morning and was isolated to the palm and second/third/fourth fingers.  Denies fall/injury, fever/chills, headache, vision changes, chest pain/shortness of breath, abdominal pain, saddle paresthesias, bowel/bladder incontinence, urinary retention or any additional concerns.  HPI     Past Medical History:  Diagnosis Date  . Anxiety   . Cerebral venous thrombosis   . Chest pain   . Coagulopathy (HCC)    borderline ATIII def. with h/o sagital sinus thrombosis   . Gestational diabetes     Patient Active Problem List   Diagnosis Date Noted  . Labor and delivery, indication for care 05/18/2019  . Abnormal glucose tolerance test (GTT) during pregnancy, antepartum 03/18/2019    Past Surgical History:  Procedure Laterality Date  . NO PAST SURGERIES       OB History    Gravida  2   Para  2   Term  2   Preterm      AB      Living  2     SAB      IAB      Ectopic      Multiple  0   Live Births  2           Family History  Problem Relation Age of Onset  . Hypertension Mother   . Thyroid disease Mother   . Hyperlipidemia Mother   . Diabetes Father   . Heart disease Father   . Bladder Cancer Maternal Grandfather   . Vision loss Maternal  Grandfather        cataract  . Heart disease Paternal Grandmother   . Hypertension Paternal Grandmother   . COPD Paternal Grandmother   . Diabetes Paternal Grandmother     Social History   Tobacco Use  . Smoking status: Never Smoker  . Smokeless tobacco: Never Used  Vaping Use  . Vaping Use: Never used  Substance Use Topics  . Alcohol use: Not Currently    Comment: occasional  . Drug use: No    Home Medications Prior to Admission medications   Medication Sig Start Date End Date Taking? Authorizing Provider  lidocaine (LIDODERM) 5 % Place 1 patch onto the skin daily. Remove & Discard patch within 12 hours or as directed by MD 04/01/20  Yes Harlene SaltsMorelli, Brandon A, PA-C  methocarbamol (ROBAXIN) 500 MG tablet Take 1 tablet (500 mg total) by mouth 2 (two) times daily for 7 days. 04/01/20 04/08/20 Yes Harlene SaltsMorelli, Brandon A, PA-C  acetaminophen (TYLENOL) 325 MG tablet Take 650 mg by mouth every 6 (six) hours as needed for mild pain or headache.    [provider]  cetirizine (ZYRTEC) 10 MG tablet Take 10 mg by mouth daily as needed for allergies.     [provider]  enoxaparin (LOVENOX) 120 MG/0.8ML  injection Inject 0.8 mLs (120 mg total) into the skin 2 (two) times daily. 05/20/19 07/04/19  Myna Hidalgo, DO  enoxaparin (LOVENOX) 120 MG/0.8ML injection Inject 0.8 mLs (120 mg total) into the skin every 12 (twelve) hours. 05/20/19 06/30/19  Myna Hidalgo, DO  ibuprofen (ADVIL) 600 MG tablet Take 1 tablet (600 mg total) by mouth every 6 (six) hours as needed for moderate pain. 05/20/19   Myna Hidalgo, DO  omeprazole (PRILOSEC) 20 MG capsule Take 20 mg by mouth 2 (two) times daily as needed (indigestion/heartburn).    [provider]  Prenatal Vit-Fe Fumarate-FA (PRENATAL VITAMIN PO) Take 1 tablet by mouth daily.     [provider]  sertraline (ZOLOFT) 100 MG tablet Take 50 mg by mouth daily.  04/14/17   [provider]  triamcinolone cream (KENALOG) 0.1 %  Apply 1 application topically 4 (four) times daily as needed (itching).    [provider]    Allergies    Patient has no known allergies.  Review of Systems   Review of Systems  Constitutional: Negative.  Negative for chills and fever.  Respiratory: Negative.  Negative for shortness of breath.   Cardiovascular: Negative.  Negative for chest pain.  Gastrointestinal: Negative.  Negative for abdominal pain.  Musculoskeletal: Positive for back pain and neck pain.  Skin: Negative.  Negative for wound.  Neurological: Positive for numbness (Paresthesias bilateral upper extremities). Negative for weakness and headaches.       Denies saddle area paresthesias. Denies bowel/bladder incontinence. Denies urinary retention.    Physical Exam Updated Vital Signs BP 124/71 (BP Location: Left Arm)   Pulse 85   Temp 97.9 F (36.6 C) (Oral)   Resp 18   Ht 5\' 2"  (1.575 m)   Wt 80.3 kg   LMP 03/06/2020 (Approximate)   SpO2 99%   Breastfeeding No   BMI 32.37 kg/m   Physical Exam Constitutional:      General: She is not in acute distress.    Appearance: Normal appearance. She is well-developed. She is not ill-appearing or diaphoretic.  HENT:     Head: Normocephalic and atraumatic.  Eyes:     General: Vision grossly intact. Gaze aligned appropriately.     Pupils: Pupils are equal, round, and reactive to light.  Neck:     Trachea: Trachea and phonation normal.  Cardiovascular:     Rate and Rhythm: Normal rate and regular rhythm.     Pulses:          Radial pulses are 2+ on the right side and 2+ on the left side.  Pulmonary:     Effort: Pulmonary effort is normal. No respiratory distress.  Abdominal:     General: There is no distension.     Palpations: Abdomen is soft.     Tenderness: There is no abdominal tenderness. There is no guarding or rebound.  Musculoskeletal:        General: Normal range of motion.     Cervical back: Normal range of motion.  Skin:    General: Skin  is warm and dry.  Neurological:     Mental Status: She is alert.     GCS: GCS eye subscore is 4. GCS verbal subscore is 5. GCS motor subscore is 6.     Comments: Speech is clear and goal oriented, follows commands Major Cranial nerves without deficit, no facial droop Normal strength in upper and lower extremities bilaterally including dorsiflexion and plantar flexion, strong and equal grip strength Sensation  normal to light and sharp touch Moves extremities without ataxia, coordination intact  Psychiatric:        Behavior: Behavior normal.    ED Results / Procedures / Treatments   Labs (all labs ordered are listed, but only abnormal results are displayed) Labs Reviewed  PREGNANCY, URINE    EKG None  Radiology DG Cervical Spine Complete  Result Date: 04/01/2020 CLINICAL DATA:  Upper back pain radiating into neck for 1 week, denies injury EXAM: CERVICAL SPINE - COMPLETE 4+ VIEW COMPARISON:  None FINDINGS: Slight reversal of cervical lordosis question muscle spasm. Vertebral body and disc space heights maintained. Prevertebral soft tissues normal thickness. No fracture, subluxation, or bone destruction. Bony foramina patent. Lung apices clear. IMPRESSION: Question muscle spasm; otherwise negative exam Electronically Signed   By: Ulyses Southward M.D.   On: 04/01/2020 17:11   DG Thoracic Spine 2 View  Result Date: 04/01/2020 CLINICAL DATA:  Upper back pain for 1 week, no injury EXAM: THORACIC SPINE 2 VIEWS COMPARISON:  None. FINDINGS: There is no evidence of thoracic spine fracture. Alignment is normal. No other significant bone abnormalities are identified. IMPRESSION: No fracture or dislocation of the thoracic spine. Disc spaces and vertebral body heights are preserved. Electronically Signed   By: Lauralyn Primes M.D.   On: 04/01/2020 17:10    Procedures Procedures   Medications Ordered in ED Medications - No data to display  ED Course  I have reviewed the triage vital signs and the  nursing notes.  Pertinent labs & imaging results that were available during my care of the patient were reviewed by me and considered in my medical decision making (see chart for details).    MDM Rules/Calculators/A&P                         Additional history obtained from: 1. Nursing notes from this visit. 2. Review of electronic medical records. ================= 35 year old female presented neck back pain since switching pillows a week ago.  Additionally patient reports episode of temporary paresthesias to the bilateral hands after waking up in the possible median nerve distribution.  Paresthesias have completely resolved.  On exam she is well-appearing no acute distress vital signs are stable.  Denies infectious symptoms.  Low suspicion for meningitis, dissection, cauda equina or other emergent pathologies at this time.  Suspect patient with muscle spasm as cause of pain.  Do not feel CT or MRI are indicated at this time, shared decision making made with patient she asks for plain film x-rays today which I feel are reasonable given low suspicion for emergent pathologies.  Patient seen and evaluated by Dr. Stevie Kern who agrees with obtaining plain film x-ray of the cervical and thoracic spine.  DG Cervical Spine:  IMPRESSION:  Question muscle spasm; otherwise negative exam   DG Thoracic Spine:  IMPRESSION:  No fracture or dislocation of the thoracic spine. Disc spaces and  vertebral body heights are preserved.   Urine Pregnancy: Negative ---------------- Patient reassessed resting comfortably bed no acute distress.  Patient informed of limitations of x-rays today and she states understanding.  Patient's exam and history is consistent with muscular spasm.  Plan of care start patient on Robaxin 500 mg twice daily as well as Lidoderm patches and OTC anti-inflammatories for her symptoms.  We discussed muscle relaxer precautions and patient stated understanding.  Patient has no increased risk  factors to suggest spinal epidural abscess.  No symptoms to suggest cauda equina syndrome.  Additionally no urinary symptoms to suggest kidney stone disease.  Neurovascular tact all of her extremities doubt AAA, dissection or other emergent pathologies at this time.  Patient is ambulatory in emergency department, no indication for further work-up.  At this time there does not appear to be any evidence of an acute emergency medical condition and the patient appears stable for discharge with appropriate outpatient follow up. Diagnosis was discussed with patient who verbalizes understanding of care plan and is agreeable to discharge. I have discussed return precautions with patient who verbalizes understanding. Patient encouraged to follow-up with their PCP. All questions answered.  Patient's case rediscussed with Dr. Stevie Kern who agrees with plan to discharge with follow-up.   Note: Portions of this report may have been transcribed using voice recognition software. Every effort was made to ensure accuracy; however, inadvertent computerized transcription errors may still be present. Final Clinical Impression(s) / ED Diagnoses Final diagnoses:  Midline back pain  Muscle spasm of back    Rx / DC Orders ED Discharge Orders         Ordered    methocarbamol (ROBAXIN) 500 MG tablet  2 times daily        04/01/20 1734    lidocaine (LIDODERM) 5 %  Every 24 hours        04/01/20 1734           Elizabeth Palau 04/01/20 1738    Milagros Loll, MD 04/01/20 2320

## 2020-04-01 NOTE — Discharge Instructions (Addendum)
At this time there does not appear to be the presence of an emergent medical condition, however there is always the potential for conditions to change. Please read and follow the below instructions.  Please return to the Emergency Department immediately for any new or worsening symptoms. Please be sure to follow up with your Primary Care Provider within one week regarding your visit today; please call their office to schedule an appointment even if you are feeling better for a follow-up visit. You may use the muscle relaxer Robaxin as prescribed to help with your symptoms.  Do not drive or operate heavy machinery while taking Robaxin as it will make you drowsy.  Do not drink alcohol or take other sedating medications while taking Robaxin as this will worsen side effects.\ You may use the Lidoderm patch as prescribed to help with your symptoms.  Lidoderm may be expensive so you may speak with your pharmacist about finding over-the-counter medications that work similarly.  Go to the nearest Emergency Department immediately if: You have fever or chills You develop new bowel or bladder control problems. You have unusual weakness or numbness in your arms or legs. You develop nausea or vomiting. You develop abdominal pain. You feel faint. You have any new/concerning or worsening of symptoms   Please read the additional information packets attached to your discharge summary.  Do not take your medicine if  develop an itchy rash, swelling in your mouth or lips, or difficulty breathing; call 911 and seek immediate emergency medical attention if this occurs.  You may review your lab tests and imaging results in their entirety on your MyChart account.  Please discuss all results of fully with your primary care provider and other specialist at your follow-up visit.  Note: Portions of this text may have been transcribed using voice recognition software. Every effort was made to ensure accuracy; however,  inadvertent computerized transcription errors may still be present.

## 2020-04-01 NOTE — ED Triage Notes (Signed)
Pt presents with upper back pain radiating into her neck x 1 week. Denies falls or other injury

## 2020-04-01 NOTE — ED Notes (Signed)
Taken to xray.

## 2022-07-20 IMAGING — DX DG CERVICAL SPINE COMPLETE 4+V
7 series · 7 of 7 positions shown · non-contrast
Comparison: None

CLINICAL DATA: Upper back pain radiating into neck for 1 week,
denies injury

EXAM:
CERVICAL SPINE - COMPLETE 4+ VIEW

[c-spine lat]
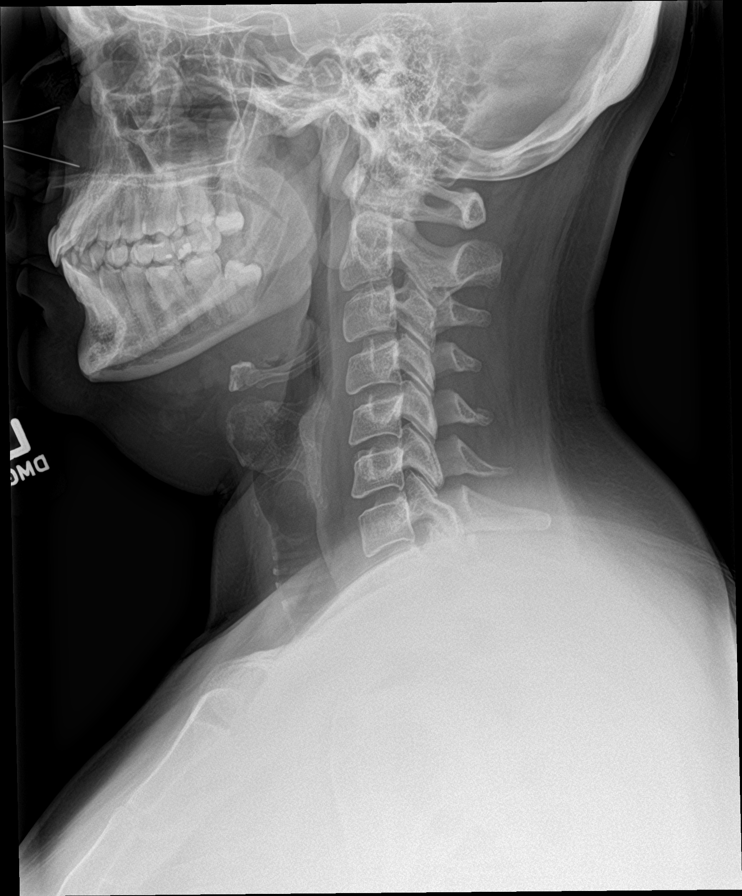

[c-spine obl (1 of 2)]
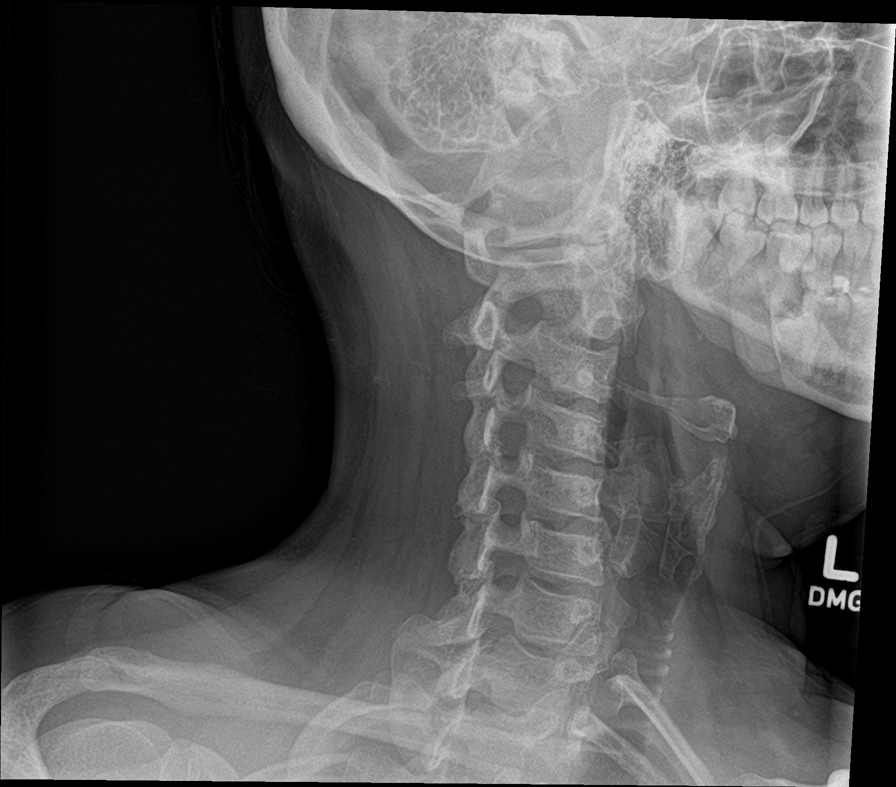

[c-spine obl (2 of 2)]
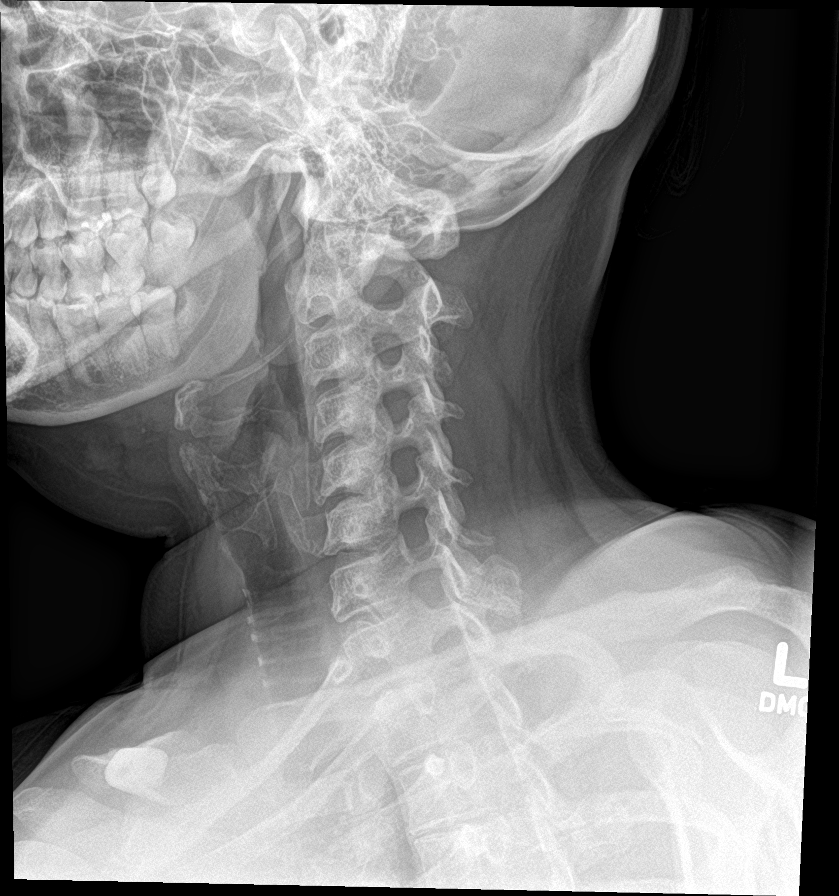

[c-spine ap]
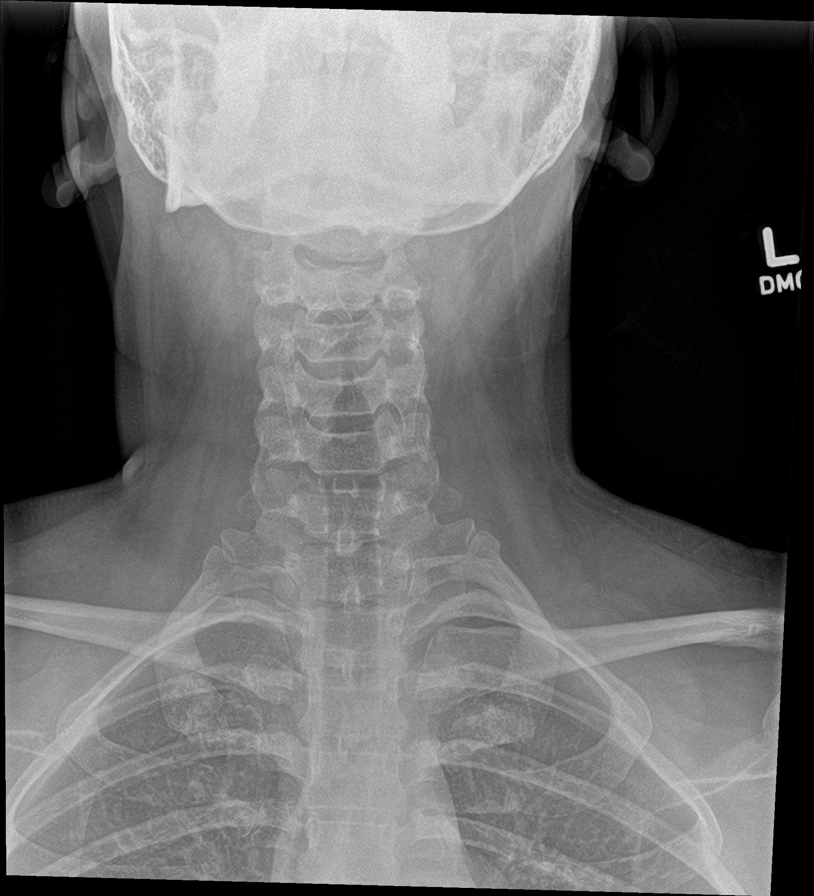

[c-spine open mouth]
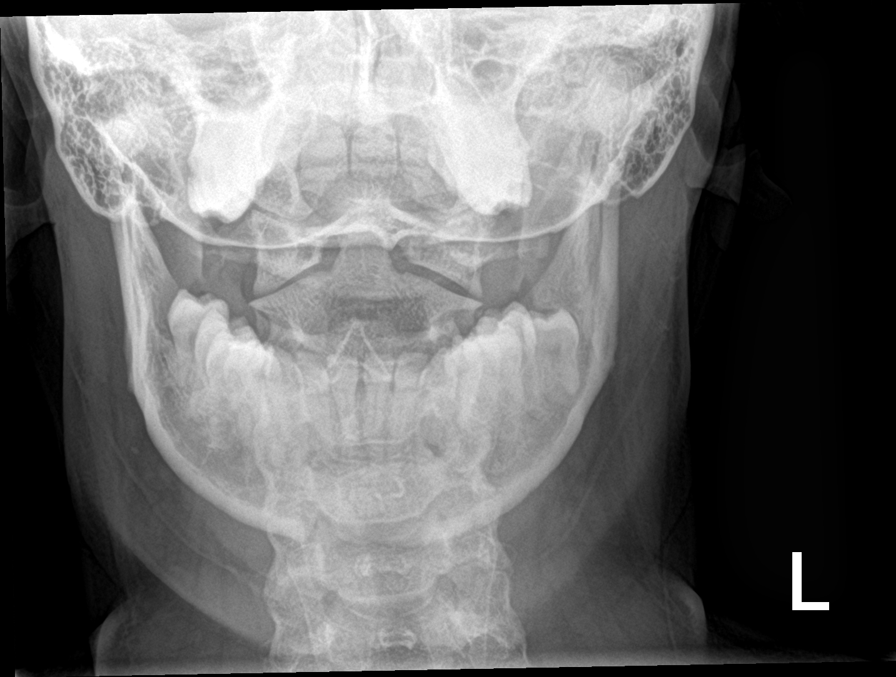

[c-spine swimmers]
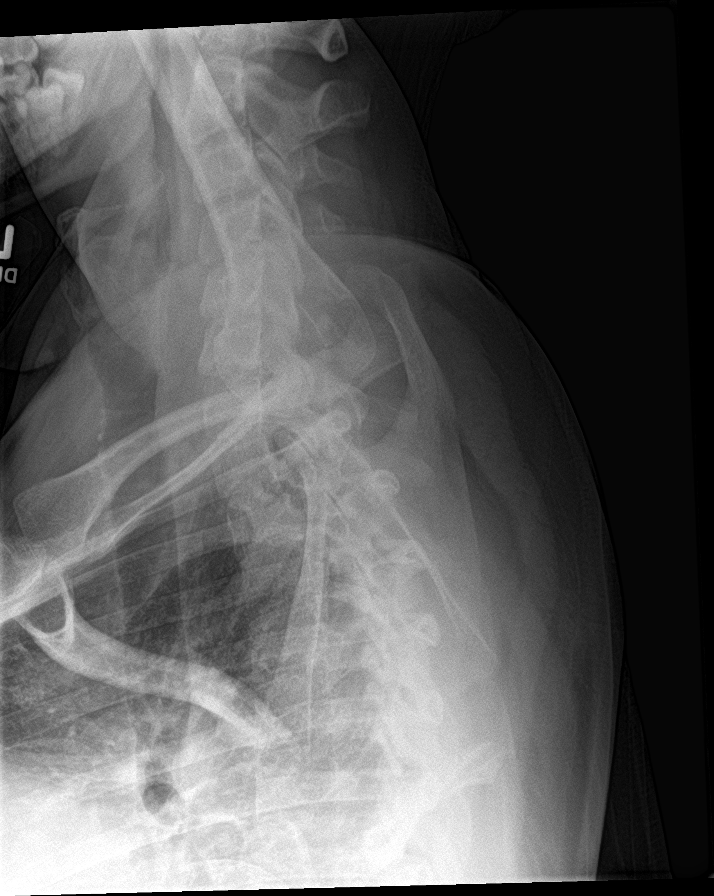

[[person_name]]
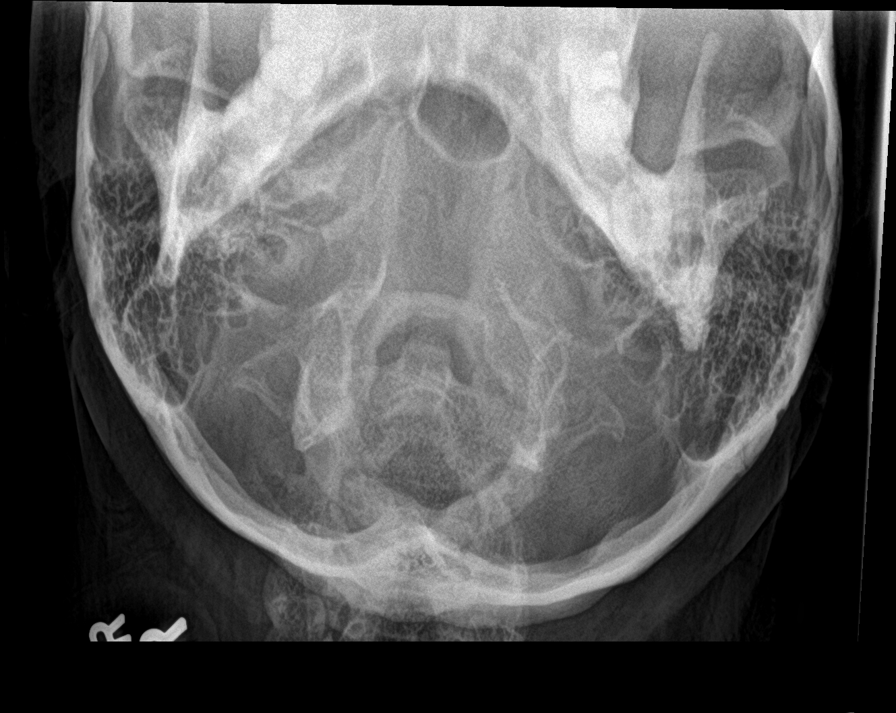

[7 of 7 positions shown; findings below may reference images not displayed]

FINDINGS: Slight reversal of cervical lordosis question muscle spasm.

Vertebral body and disc space heights maintained.

Prevertebral soft tissues normal thickness.

No fracture, subluxation, or bone destruction.

Bony foramina patent.

Lung apices clear.
IMPRESSION: Question muscle spasm; otherwise negative exam

## 2022-11-16 ENCOUNTER — Other Ambulatory Visit: Payer: Self-pay

## 2022-11-16 ENCOUNTER — Encounter (HOSPITAL_COMMUNITY): Payer: Self-pay

## 2022-11-16 ENCOUNTER — Emergency Department (HOSPITAL_COMMUNITY): Payer: 59

## 2022-11-16 ENCOUNTER — Observation Stay (HOSPITAL_COMMUNITY)
Admission: EM | Admit: 2022-11-16 | Discharge: 2022-11-18 | Disposition: A | Discharge status: Home / Self Care | Payer: 59 | Attending: Orthopedic Surgery | Admitting: Orthopedic Surgery

## 2022-11-16 DIAGNOSIS — S82401A Unspecified fracture of shaft of right fibula, initial encounter for closed fracture: Secondary | ICD-10-CM | POA: Diagnosis not present

## 2022-11-16 DIAGNOSIS — Y9341 Activity, dancing: Secondary | ICD-10-CM | POA: Diagnosis not present

## 2022-11-16 DIAGNOSIS — W1830XA Fall on same level, unspecified, initial encounter: Secondary | ICD-10-CM | POA: Insufficient documentation

## 2022-11-16 DIAGNOSIS — S82201A Unspecified fracture of shaft of right tibia, initial encounter for closed fracture: Principal | ICD-10-CM

## 2022-11-16 DIAGNOSIS — S82871A Displaced pilon fracture of right tibia, initial encounter for closed fracture: Principal | ICD-10-CM | POA: Diagnosis present

## 2022-11-16 DIAGNOSIS — S82101A Unspecified fracture of upper end of right tibia, initial encounter for closed fracture: Secondary | ICD-10-CM | POA: Diagnosis present

## 2022-11-16 MED ORDER — MORPHINE SULFATE (PF) 2 MG/ML IV SOLN
2.0000 mg | Freq: Once | INTRAVENOUS | Status: AC
  Administered 2022-11-16: 2 mg via INTRAVENOUS
  Filled 2022-11-16: qty 1

## 2022-11-16 MED ORDER — MORPHINE SULFATE (PF) 4 MG/ML IV SOLN
4.0000 mg | Freq: Once | INTRAVENOUS | Status: AC
Start: 2022-11-16 — End: 2022-11-16
  Administered 2022-11-16: 4 mg via INTRAVENOUS
  Filled 2022-11-16: qty 1

## 2022-11-16 MED ORDER — OXYCODONE-ACETAMINOPHEN 5-325 MG PO TABS
1.0000 | ORAL_TABLET | Freq: Once | ORAL | Status: AC
  Administered 2022-11-16: 1 via ORAL
  Filled 2022-11-16: qty 1

## 2022-11-16 MED ORDER — KETOROLAC TROMETHAMINE 30 MG/ML IJ SOLN
30.0000 mg | Freq: Once | INTRAMUSCULAR | Status: AC
Start: 2022-11-16 — End: 2022-11-16
  Administered 2022-11-16: 30 mg via INTRAMUSCULAR
  Filled 2022-11-16: qty 1

## 2022-11-16 NOTE — ED Triage Notes (Signed)
Injured RLE while dancing, denies other injuries.  Alert on arrival.  Received 75 mcg Fentanyl en route

## 2022-11-16 NOTE — Progress Notes (Signed)
Orthopedic Tech Progress Note Patient Details:  Gracin Abramyan 1985-03-06 [id number]  Ortho Devices Type of Ortho Device: Post (short) splint, Stirrup splint Ortho Device/Splint Location: RLE Ortho Device/Splint Interventions: Ordered, Application   Post Interventions Patient Tolerated: Sallye Ober 11/16/2022, 6:44 PM

## 2022-11-16 NOTE — ED Notes (Signed)
Orthopedic Surgery at bedside, Dr. Linna Caprice, MD.

## 2022-11-16 NOTE — H&P (Signed)
ORTHOPAEDIC H&P  REQUESTING PHYSICIAN: Jacalyn Lefevre, MD  PCP:  [facility] @ [place]  Chief Complaint: right lower leg injury  HPI: Kimberly Parks is a 37 y.o. female who sustained an injury to her right lower extremity earlier today.  She was rollerskating at her niece's birthday party, when she fell.  She had immediate right lower leg pain and inability to weight-bear.  She was brought to the emergency department at [facility], where x-rays of the tibia and ankle revealed a comminuted distal third fracture of the tibia and fibula with intra-articular extension.  Orthopedic consultation was placed for management of her injury.  She denies other injuries.  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  She is not on blood thinners at the present time.  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    Gestational diabetes    Past Surgical History:  Procedure Laterality Date   NO PAST SURGERIES     Social History   Socioeconomic History   Marital status: Married    Spouse name: Not on file   Number of children: Not on file   Years of education: Not on file   Highest education level: Not on file  Occupational History   Not on file  Tobacco Use   Smoking status: Never   Smokeless tobacco: Never  Vaping Use   Vaping status: Never Used  Substance and Sexual Activity   Alcohol use: Not Currently    Comment: occasional   Drug use: No   Sexual activity: Yes  Other Topics Concern   Not on file  Social History Narrative   Not on file   Social Determinants of Health   Financial Resource Strain: Low Risk  (11/21/2018)   Overall Financial Resource Strain (CARDIA)    Difficulty of Paying Living Expenses: Not hard at all  Food Insecurity: No Food Insecurity  (11/21/2018)   Hunger Vital Sign    Worried About Running Out of Food in the Last Year: Never true    Ran Out of Food in the Last Year: Never true  Transportation Needs: No Transportation Needs (11/21/2018)   PRAPARE - Administrator, Civil Service (Medical): No    Lack of Transportation (Non-Medical): No  Physical Activity: Not on file  Stress: Not on file  Social Connections: Not on file   Family History  Problem Relation Age of Onset   Hypertension Mother    Thyroid disease Mother    Hyperlipidemia Mother    Diabetes Father    Heart disease Father    Bladder Cancer Maternal Grandfather    Vision loss Maternal Grandfather        cataract   Heart disease Paternal Grandmother    Hypertension Paternal Grandmother    COPD Paternal Grandmother    Diabetes Paternal Grandmother    No Known Allergies Prior to Admission medications   Medication Sig Start Date End Date Taking? Authorizing Provider  acetaminophen (TYLENOL) 325 MG tablet Take 650 mg by mouth every 6 (six) hours as needed for mild pain or headache.    [provider]  cetirizine (ZYRTEC) 10 MG tablet Take 10 mg by mouth daily as needed for allergies.     [provider]  enoxaparin (  LOVENOX) 120 MG/0.8ML injection Inject 0.8 mLs (120 mg total) into the skin 2 (two) times daily. 05/20/19 07/04/19  Myna Hidalgo, DO  enoxaparin (LOVENOX) 120 MG/0.8ML injection Inject 0.8 mLs (120 mg total) into the skin every 12 (twelve) hours. 05/20/19 06/30/19  Myna Hidalgo, DO  ibuprofen (ADVIL) 600 MG tablet Take 1 tablet (600 mg total) by mouth every 6 (six) hours as needed for moderate pain. 05/20/19   Myna Hidalgo, DO  lidocaine (LIDODERM) 5 % Place 1 patch onto the skin daily. Remove & Discard patch within 12 hours or as directed by MD 04/01/20   Bill Salinas, PA-C  omeprazole (PRILOSEC) 20 MG capsule Take 20 mg by mouth 2 (two) times daily as needed (indigestion/heartburn).    [provider]   Prenatal Vit-Fe Fumarate-FA (PRENATAL VITAMIN PO) Take 1 tablet by mouth daily.     [provider]  sertraline (ZOLOFT) 100 MG tablet Take 50 mg by mouth daily.  04/14/17   [provider]  triamcinolone cream (KENALOG) 0.1 % Apply 1 application topically 4 (four) times daily as needed (itching).    [provider]   DG Ankle 2 Views Right  Result Date: 11/16/2022 CLINICAL DATA:  Right lower extremity pain after injury on roller blades. EXAM: RIGHT ANKLE - 2 VIEW COMPARISON:  None Available. FINDINGS: Moderately displaced and comminuted fractures are seen involving the distal right tibia and fibula. The tibial fracture extends into the talotibial joint. IMPRESSION: Moderately displaced and comminuted distal right tibial and fibular fractures. Tibial fracture extends into talotibial joint. Electronically Signed   By: Lupita Raider M.D.   On: 11/16/2022 16:27   DG Tibia/Fibula Right  Result Date: 11/16/2022 CLINICAL DATA:  Right lower extremity pain after injury on roller skates. EXAM: RIGHT TIBIA AND FIBULA - 2 VIEW COMPARISON:  None Available. FINDINGS: Moderately displaced and comminuted fractures are seen involving the distal right tibia and fibula. IMPRESSION: Moderately displaced and comminuted distal right tibial and fibular fractures. Electronically Signed   By: Lupita Raider M.D.   On: 11/16/2022 16:26    Positive ROS: All other systems have been reviewed and were otherwise negative with the exception of those mentioned in the HPI and as above.  Physical Exam: General: Alert, no acute distress Cardiovascular: No pedal edema Respiratory: No cyanosis, no use of accessory musculature GI: No organomegaly, abdomen is soft and non-tender Skin: No lesions in the area of chief complaint Neurologic: Sensation intact distally Psychiatric: Patient is competent for consent with normal mood and affect Lymphatic: No axillary or cervical  lymphadenopathy  MUSCULOSKELETAL: Examination of the right lower extremity reveals that her skin is intact.  There is no obvious deformity.  She has moderate swelling at the fracture site.  Skin wrinkling is present.  Compartments are soft and compressible.  No focal motor or sensory deficit.  She has palpable pedal pulses.  Assessment: Closed comminuted right distal third tib-fib fracture with intra-articular extension  Plan: I discussed the findings with the patient and her husband.  Recommend short leg splint, followed by CT scan.  Nonweightbearing right lower extremity.  Strict elevation toes above nose on blankets.  Ice as needed. She will require surgical stabilization of her injury.  I have discussed the patient with Dr. Victorino Dike, foot and ankle specialist, who will discuss surgery further with her tomorrow morning.   I will admit her to [facility] at the request of Dr. Victorino Dike.  We will hold chemical DVT prophylaxis tonight.  N.p.o. after midnight.  All questions solicited and answered.    Jonette Pesa, MD [phone]    11/16/2022 6:02 PM

## 2022-11-16 NOTE — ED Provider Notes (Signed)
[place] EMERGENCY DEPARTMENT AT [facility] Provider Note   CSN: [id number] Arrival date & time: 11/16/22  1401     History  Chief Complaint  Patient presents with   Leg Injury    Kimberly Parks is a 37 y.o. female past medical history significant for Antithrombin 3 deficiency and anxiety presents today after right lower extremity injury.  The patient states she was at the skating rink "participating in a [place] contest" when she fell and hurt her right leg.  Patient denies hitting her head, any other injuries, numbness, or tingling.  Patient endorses pain and inability to walk.  Patient took an as needed benzodiazepine and received fentanyl from EMS.  HPI     Home Medications Prior to Admission medications   Medication Sig Start Date End Date Taking? Authorizing Provider  acetaminophen (TYLENOL) 325 MG tablet Take 650 mg by mouth every 6 (six) hours as needed for mild pain or headache.   Yes [provider]  cetirizine (ZYRTEC) 10 MG tablet Take 10 mg by mouth daily as needed for allergies.    Yes [provider]  clonazePAM (KLONOPIN) 0.5 MG tablet Take 0.5 mg by mouth daily as needed.   Yes [provider]  ibuprofen (ADVIL) 600 MG tablet Take 1 tablet (600 mg total) by mouth every 6 (six) hours as needed for moderate pain. 05/20/19  Yes Myna Hidalgo, DO  omeprazole (PRILOSEC) 20 MG capsule Take 20 mg by mouth 2 (two) times daily as needed (indigestion/heartburn).   Yes [provider]  sertraline (ZOLOFT) 100 MG tablet Take 100 mg by mouth at bedtime. 04/14/17  Yes [provider]      Allergies    Buspirone and Amoxicillin    Review of Systems   Review of Systems  Musculoskeletal:  Positive for arthralgias.    Physical Exam Updated Vital Signs BP 119/74   Pulse 79   Temp 98.2 F (36.8 C) (Oral)   Resp 18   Ht 5\' 2"  (1.575 m)   Wt 79.4 kg   SpO2 99%   BMI 32.01 kg/m  Physical Exam Vitals and nursing  note reviewed.  Constitutional:      General: She is not in acute distress.    Appearance: She is well-developed.  HENT:     Head: Normocephalic and atraumatic.     Right Ear: External ear normal.     Left Ear: External ear normal.  Eyes:     Conjunctiva/sclera: Conjunctivae normal.  Cardiovascular:     Rate and Rhythm: Normal rate and regular rhythm.     Heart sounds: No murmur heard. Pulmonary:     Effort: Pulmonary effort is normal. No respiratory distress.     Breath sounds: Normal breath sounds.  Abdominal:     Palpations: Abdomen is soft.     Tenderness: There is no abdominal tenderness.  Musculoskeletal:        General: No swelling.     Cervical back: Neck supple.     Right lower leg: Deformity, tenderness and bony tenderness present.     Comments: Mild deformity to right lower leg just proximal to ankle.  Patient is neurovascularly intact and able to wiggle her toes.  Patient has bony tenderness to fibula and tibia.  Skin:    General: Skin is warm and dry.     Capillary Refill: Capillary refill takes less than 2 seconds.  Neurological:     Mental Status: She is alert.  Psychiatric:  Mood and Affect: Mood normal.     ED Results / Procedures / Treatments   Labs (all labs ordered are listed, but only abnormal results are displayed) Labs Reviewed - No data to display  EKG None  Radiology DG Ankle 2 Views Right  Result Date: 11/16/2022 CLINICAL DATA:  Right lower extremity pain after injury on roller blades. EXAM: RIGHT ANKLE - 2 VIEW COMPARISON:  None Available. FINDINGS: Moderately displaced and comminuted fractures are seen involving the distal right tibia and fibula. The tibial fracture extends into the talotibial joint. IMPRESSION: Moderately displaced and comminuted distal right tibial and fibular fractures. Tibial fracture extends into talotibial joint. Electronically Signed   By: Lupita Raider M.D.   On: 11/16/2022 16:27   DG Tibia/Fibula  Right  Result Date: 11/16/2022 CLINICAL DATA:  Right lower extremity pain after injury on roller skates. EXAM: RIGHT TIBIA AND FIBULA - 2 VIEW COMPARISON:  None Available. FINDINGS: Moderately displaced and comminuted fractures are seen involving the distal right tibia and fibula. IMPRESSION: Moderately displaced and comminuted distal right tibial and fibular fractures. Electronically Signed   By: Lupita Raider M.D.   On: 11/16/2022 16:26    Procedures Procedures    Medications Ordered in ED Medications  oxyCODONE-acetaminophen (PERCOCET/ROXICET) 5-325 MG per tablet 1 tablet (1 tablet Oral Given 11/16/22 1533)  ketorolac (TORADOL) 30 MG/ML injection 30 mg (30 mg Intramuscular Given 11/16/22 1652)  oxyCODONE-acetaminophen (PERCOCET/ROXICET) 5-325 MG per tablet 1 tablet (1 tablet Oral Given 11/16/22 1652)  morphine (PF) 4 MG/ML injection 4 mg (4 mg Intravenous Given 11/16/22 1858)    ED Course/ Medical Decision Making/ A&P                                 Medical Decision Making Amount and/or Complexity of Data Reviewed Radiology: ordered.  Risk Prescription drug management.   This patient presents to the ED with chief complaint(s) of right leg pain with pertinent past medical history of Antithrombin III deficiency which further complicates the presenting complaint. The complaint involves an extensive differential diagnosis and also carries with it a high risk of complications and morbidity.    The differential diagnosis includes leg fracture, musculoskeletal pain,  Additional history obtained: Records reviewed Primary Care Documents  ED Course and Reassessment: Patient given Percocet for pain  Independent visualization of imaging: - I independently visualized the following imaging with scope of interpretation limited to determining acute life threatening conditions related to emergency care: Right ankle and tib/fib x-ray, which revealed Moderately displaced and comminuted distal  right tibial and fibular fractures. Tibial fracture extends into talotibial joint.   Consultation: - Consulted or discussed management/test interpretation w/ external professional: Dr. Linna Caprice with orthopedics who recommended splinting and teaching after splinting.  Consideration for admission or further workup: Admitting patient due to soft tissue swelling for observation.  Dr. Linna Caprice with orthopedics agreeable to admission for observation until surgery most likely on Monday or Tuesday.  Patient informed of plan and agreeable to transfer to [facility].        Final Clinical Impression(s) / ED Diagnoses Final diagnoses:  Closed fracture of right tibia and fibula, initial encounter    Rx / DC Orders ED Discharge Orders     None         Gretta Began 11/16/22 1929    Jacalyn Lefevre, MD 11/16/22 1940

## 2022-11-17 ENCOUNTER — Encounter (HOSPITAL_COMMUNITY)
Admission: EM | Disposition: A | Discharge status: Home / Self Care | Payer: Self-pay | Source: Home / Self Care | Attending: Emergency Medicine

## 2022-11-17 ENCOUNTER — Inpatient Hospital Stay (HOSPITAL_COMMUNITY): Payer: 59

## 2022-11-17 ENCOUNTER — Inpatient Hospital Stay (HOSPITAL_COMMUNITY): Payer: 59 | Admitting: Anesthesiology

## 2022-11-17 ENCOUNTER — Other Ambulatory Visit: Payer: Self-pay

## 2022-11-17 ENCOUNTER — Encounter (HOSPITAL_COMMUNITY): Payer: Self-pay | Admitting: Orthopedic Surgery

## 2022-11-17 DIAGNOSIS — W1830XA Fall on same level, unspecified, initial encounter: Secondary | ICD-10-CM | POA: Diagnosis not present

## 2022-11-17 DIAGNOSIS — Y9341 Activity, dancing: Secondary | ICD-10-CM | POA: Diagnosis not present

## 2022-11-17 DIAGNOSIS — S82871A Displaced pilon fracture of right tibia, initial encounter for closed fracture: Secondary | ICD-10-CM

## 2022-11-17 DIAGNOSIS — S82401A Unspecified fracture of shaft of right fibula, initial encounter for closed fracture: Secondary | ICD-10-CM | POA: Diagnosis not present

## 2022-11-17 HISTORY — PX: ORIF TIBIA FRACTURE: SHX5416

## 2022-11-17 LAB — CBC
HCT: 38.9 % (ref 36.0–46.0)
Hemoglobin: 12 g/dL (ref 12.0–15.0)
MCH: 25.1 pg — ABNORMAL LOW (ref 26.0–34.0)
MCHC: 30.8 g/dL (ref 30.0–36.0)
MCV: 81.2 fL (ref 80.0–100.0)
Platelets: 220 10*3/uL (ref 150–400)
RBC: 4.79 MIL/uL (ref 3.87–5.11)
RDW: 14.6 % (ref 11.5–15.5)
WBC: 10.6 10*3/uL — ABNORMAL HIGH (ref 4.0–10.5)
nRBC: 0 % (ref 0.0–0.2)

## 2022-11-17 LAB — CREATININE, SERUM
Creatinine, Ser: 0.74 mg/dL (ref 0.44–1.00)
GFR, Estimated: >60 mL/min (ref >60)

## 2022-11-17 LAB — SURGICAL PCR SCREEN
MRSA, PCR: NEGATIVE
Staphylococcus aureus: NEGATIVE

## 2022-11-17 LAB — PREGNANCY, URINE: Preg Test, Ur: NEGATIVE

## 2022-11-17 SURGERY — OPEN REDUCTION INTERNAL FIXATION (ORIF) TIBIA FRACTURE
Anesthesia: General | Laterality: Right

## 2022-11-17 MED ORDER — LIDOCAINE-EPINEPHRINE (PF) 1.5 %-1:200000 IJ SOLN
INTRAMUSCULAR | Status: DC | PRN
  Administered 2022-11-17: 5 mL via PERINEURAL

## 2022-11-17 MED ORDER — MORPHINE SULFATE (PF) 2 MG/ML IV SOLN
0.5000 mg | INTRAVENOUS | Status: DC | PRN
Start: 2022-11-17 — End: 2022-11-18

## 2022-11-17 MED ORDER — MIDAZOLAM HCL 2 MG/2ML IJ SOLN
INTRAMUSCULAR | Status: AC
  Administered 2022-11-17: 1 mg via INTRAVENOUS
  Filled 2022-11-17: qty 2

## 2022-11-17 MED ORDER — SERTRALINE HCL 100 MG PO TABS
100.0000 mg | ORAL_TABLET | Freq: Every day | ORAL | Status: DC
  Administered 2022-11-17: 100 mg via ORAL
  Filled 2022-11-17: qty 1

## 2022-11-17 MED ORDER — NAPROXEN 250 MG PO TABS
250.0000 mg | ORAL_TABLET | Freq: Two times a day (BID) | ORAL | Status: DC
  Administered 2022-11-17 – 2022-11-18 (×2): 250 mg via ORAL
  Filled 2022-11-17 (×2): qty 1

## 2022-11-17 MED ORDER — MAGNESIUM CITRATE PO SOLN: 1.0000 | Freq: Once | ORAL | Status: DC | PRN

## 2022-11-17 MED ORDER — CEFAZOLIN SODIUM-DEXTROSE 2-4 GM/100ML-% IV SOLN
2.0000 g | INTRAVENOUS | Status: AC
  Administered 2022-11-17: 2 g via INTRAVENOUS

## 2022-11-17 MED ORDER — OXYCODONE HCL 5 MG/5ML PO SOLN: 5.0000 mg | Freq: Once | ORAL | Status: DC | PRN

## 2022-11-17 MED ORDER — PROPOFOL 10 MG/ML IV BOLUS
INTRAVENOUS | Status: AC
  Filled 2022-11-17: qty 20

## 2022-11-17 MED ORDER — HYDROCODONE-ACETAMINOPHEN 7.5-325 MG PO TABS
1.0000 | ORAL_TABLET | ORAL | Status: DC | PRN
Start: 2022-11-17 — End: 2022-11-18

## 2022-11-17 MED ORDER — ONDANSETRON HCL 4 MG/2ML IJ SOLN
INTRAMUSCULAR | Status: AC
  Filled 2022-11-17: qty 2

## 2022-11-17 MED ORDER — LACTATED RINGERS IV SOLN: INTRAVENOUS | Status: DC | PRN

## 2022-11-17 MED ORDER — SCOPOLAMINE 1 MG/3DAYS TD PT72
MEDICATED_PATCH | TRANSDERMAL | Status: DC | PRN
  Administered 2022-11-17: 1 via TRANSDERMAL

## 2022-11-17 MED ORDER — 0.9 % SODIUM CHLORIDE (POUR BTL) OPTIME
TOPICAL | Status: DC | PRN
Start: 2022-11-17 — End: 2022-11-17
  Administered 2022-11-17: 1000 mL

## 2022-11-17 MED ORDER — HYDROMORPHONE HCL 1 MG/ML IJ SOLN
INTRAMUSCULAR | Status: DC | PRN
  Administered 2022-11-17: .5 mg via INTRAVENOUS

## 2022-11-17 MED ORDER — LIDOCAINE 2% (20 MG/ML) 5 ML SYRINGE
INTRAMUSCULAR | Status: AC
  Filled 2022-11-17: qty 5

## 2022-11-17 MED ORDER — SENNA 8.6 MG PO TABS
1.0000 | ORAL_TABLET | Freq: Two times a day (BID) | ORAL | Status: DC
  Administered 2022-11-17 – 2022-11-18 (×3): 8.6 mg via ORAL
  Filled 2022-11-17 (×3): qty 1

## 2022-11-17 MED ORDER — LORATADINE 10 MG PO TABS
10.0000 mg | ORAL_TABLET | Freq: Every day | ORAL | Status: DC
  Administered 2022-11-17 – 2022-11-18 (×2): 10 mg via ORAL
  Filled 2022-11-17 (×2): qty 1

## 2022-11-17 MED ORDER — METOCLOPRAMIDE HCL 5 MG/ML IJ SOLN: 5.0000 mg | Freq: Three times a day (TID) | INTRAMUSCULAR | Status: DC | PRN

## 2022-11-17 MED ORDER — CHLORHEXIDINE GLUCONATE 0.12 % MT SOLN
OROMUCOSAL | Status: AC
  Filled 2022-11-17: qty 15

## 2022-11-17 MED ORDER — ACETAMINOPHEN 10 MG/ML IV SOLN
1000.0000 mg | Freq: Once | INTRAVENOUS | Status: DC | PRN
Start: 2022-11-17 — End: 2022-11-17

## 2022-11-17 MED ORDER — BUPIVACAINE-EPINEPHRINE (PF) 0.5% -1:200000 IJ SOLN
INTRAMUSCULAR | Status: DC | PRN
Start: 2022-11-17 — End: 2022-11-17
  Administered 2022-11-17: 10 mL via PERINEURAL
  Administered 2022-11-17: 20 mL via PERINEURAL

## 2022-11-17 MED ORDER — FENTANYL CITRATE (PF) 100 MCG/2ML IJ SOLN
INTRAMUSCULAR | Status: AC
  Administered 2022-11-17: 50 ug
  Filled 2022-11-17: qty 2

## 2022-11-17 MED ORDER — VANCOMYCIN HCL 500 MG IV SOLR
INTRAVENOUS | Status: DC | PRN
  Administered 2022-11-17: 500 mg via TOPICAL

## 2022-11-17 MED ORDER — OXYCODONE HCL 5 MG PO TABS: 5.0000 mg | ORAL_TABLET | Freq: Once | ORAL | Status: DC | PRN

## 2022-11-17 MED ORDER — ENOXAPARIN SODIUM 40 MG/0.4ML IJ SOSY
40.0000 mg | PREFILLED_SYRINGE | INTRAMUSCULAR | Status: DC
  Administered 2022-11-18: 40 mg via SUBCUTANEOUS
  Filled 2022-11-17: qty 0.4

## 2022-11-17 MED ORDER — MIDAZOLAM HCL 2 MG/2ML IJ SOLN
1.0000 mg | Freq: Once | INTRAMUSCULAR | Status: AC
  Administered 2022-11-17: 1 mg via INTRAVENOUS

## 2022-11-17 MED ORDER — ACETAMINOPHEN 325 MG PO TABS: 325.0000 mg | ORAL_TABLET | Freq: Four times a day (QID) | ORAL | Status: DC | PRN

## 2022-11-17 MED ORDER — DEXAMETHASONE SODIUM PHOSPHATE 10 MG/ML IJ SOLN
INTRAMUSCULAR | Status: AC
  Filled 2022-11-17: qty 1

## 2022-11-17 MED ORDER — ACETAMINOPHEN 500 MG PO TABS: 1000.0000 mg | ORAL_TABLET | Freq: Once | ORAL | Status: DC | PRN

## 2022-11-17 MED ORDER — ONDANSETRON HCL 4 MG PO TABS: 4.0000 mg | ORAL_TABLET | Freq: Four times a day (QID) | ORAL | Status: DC | PRN

## 2022-11-17 MED ORDER — BUPIVACAINE LIPOSOME 1.3 % IJ SUSP
INTRAMUSCULAR | Status: AC
Start: 2022-11-17 — End: ?
  Filled 2022-11-17: qty 20

## 2022-11-17 MED ORDER — FENTANYL CITRATE PF 50 MCG/ML IJ SOSY
50.0000 ug | PREFILLED_SYRINGE | Freq: Once | INTRAMUSCULAR | Status: AC
  Administered 2022-11-17: 50 ug via INTRAVENOUS

## 2022-11-17 MED ORDER — SCOPOLAMINE 1 MG/3DAYS TD PT72
MEDICATED_PATCH | TRANSDERMAL | Status: AC
  Filled 2022-11-17: qty 1

## 2022-11-17 MED ORDER — FENTANYL CITRATE PF 50 MCG/ML IJ SOSY
50.0000 ug | PREFILLED_SYRINGE | INTRAMUSCULAR | Status: DC | PRN
  Administered 2022-11-17: 50 ug via INTRAVENOUS
  Filled 2022-11-17: qty 1

## 2022-11-17 MED ORDER — POLYETHYLENE GLYCOL 3350 17 G PO PACK: 17.0000 g | PACK | Freq: Every day | ORAL | Status: DC | PRN

## 2022-11-17 MED ORDER — ONDANSETRON HCL 4 MG/2ML IJ SOLN
INTRAMUSCULAR | Status: DC | PRN
  Administered 2022-11-17: 4 mg via INTRAVENOUS

## 2022-11-17 MED ORDER — LACTATED RINGERS IV SOLN: INTRAVENOUS | Status: DC

## 2022-11-17 MED ORDER — DOCUSATE SODIUM 100 MG PO CAPS
100.0000 mg | ORAL_CAPSULE | Freq: Two times a day (BID) | ORAL | Status: DC
  Administered 2022-11-17 – 2022-11-18 (×3): 100 mg via ORAL
  Filled 2022-11-17 (×3): qty 1

## 2022-11-17 MED ORDER — CHLORHEXIDINE GLUCONATE 4 % EX SOLN: 60.0000 mL | Freq: Once | CUTANEOUS | Status: DC

## 2022-11-17 MED ORDER — KETOROLAC TROMETHAMINE 15 MG/ML IJ SOLN
15.0000 mg | Freq: Once | INTRAMUSCULAR | Status: AC
  Administered 2022-11-17: 15 mg via INTRAVENOUS
  Filled 2022-11-17: qty 1

## 2022-11-17 MED ORDER — BUPIVACAINE LIPOSOME 1.3 % IJ SUSP
INTRAMUSCULAR | Status: AC
  Filled 2022-11-17: qty 10

## 2022-11-17 MED ORDER — MIDAZOLAM HCL 2 MG/2ML IJ SOLN
INTRAMUSCULAR | Status: AC
  Filled 2022-11-17: qty 2

## 2022-11-17 MED ORDER — BISACODYL 10 MG RE SUPP: 10.0000 mg | Freq: Every day | RECTAL | Status: DC | PRN

## 2022-11-17 MED ORDER — PHENYLEPHRINE HCL-NACL 20-0.9 MG/250ML-% IV SOLN
INTRAVENOUS | Status: DC | PRN
  Administered 2022-11-17: 10 ug/min via INTRAVENOUS

## 2022-11-17 MED ORDER — PROPOFOL 10 MG/ML IV BOLUS
INTRAVENOUS | Status: DC | PRN
  Administered 2022-11-17: 10 mg via INTRAVENOUS
  Administered 2022-11-17: 50 mg via INTRAVENOUS
  Administered 2022-11-17: 30 mg via INTRAVENOUS
  Administered 2022-11-17: 200 mg via INTRAVENOUS

## 2022-11-17 MED ORDER — FENTANYL CITRATE (PF) 100 MCG/2ML IJ SOLN: 25.0000 ug | INTRAMUSCULAR | Status: DC | PRN

## 2022-11-17 MED ORDER — BUPIVACAINE LIPOSOME 1.3 % IJ SUSP
INTRAMUSCULAR | Status: DC | PRN
  Administered 2022-11-17: 10 mL via PERINEURAL

## 2022-11-17 MED ORDER — CEFAZOLIN SODIUM-DEXTROSE 2-4 GM/100ML-% IV SOLN
INTRAVENOUS | Status: AC
  Filled 2022-11-17: qty 100

## 2022-11-17 MED ORDER — MIDAZOLAM HCL 2 MG/2ML IJ SOLN: 1.0000 mg | Freq: Once | INTRAMUSCULAR | Status: AC

## 2022-11-17 MED ORDER — ACETAMINOPHEN 160 MG/5ML PO SOLN: 1000.0000 mg | Freq: Once | ORAL | Status: DC | PRN

## 2022-11-17 MED ORDER — FENTANYL CITRATE PF 50 MCG/ML IJ SOSY: 50.0000 ug | PREFILLED_SYRINGE | Freq: Once | INTRAMUSCULAR | Status: DC

## 2022-11-17 MED ORDER — HYDROMORPHONE HCL 1 MG/ML IJ SOLN
INTRAMUSCULAR | Status: AC
  Filled 2022-11-17: qty 0.5

## 2022-11-17 MED ORDER — LIDOCAINE 2% (20 MG/ML) 5 ML SYRINGE
INTRAMUSCULAR | Status: DC | PRN
  Administered 2022-11-17: 100 mg via INTRAVENOUS

## 2022-11-17 MED ORDER — POVIDONE-IODINE 10 % EX SWAB
2.0000 | Freq: Once | CUTANEOUS | Status: AC
  Administered 2022-11-17: 2 via TOPICAL

## 2022-11-17 MED ORDER — KETOROLAC TROMETHAMINE 30 MG/ML IJ SOLN
INTRAMUSCULAR | Status: DC | PRN
  Administered 2022-11-17: 30 mg via INTRAVENOUS

## 2022-11-17 MED ORDER — FENTANYL CITRATE (PF) 250 MCG/5ML IJ SOLN
INTRAMUSCULAR | Status: AC
  Filled 2022-11-17: qty 5

## 2022-11-17 MED ORDER — METOCLOPRAMIDE HCL 5 MG PO TABS: 5.0000 mg | ORAL_TABLET | Freq: Three times a day (TID) | ORAL | Status: DC | PRN

## 2022-11-17 MED ORDER — DEXAMETHASONE SODIUM PHOSPHATE 10 MG/ML IJ SOLN
INTRAMUSCULAR | Status: DC | PRN
  Administered 2022-11-17: 10 mg via INTRAVENOUS

## 2022-11-17 MED ORDER — HYDROCODONE-ACETAMINOPHEN 5-325 MG PO TABS
1.0000 | ORAL_TABLET | ORAL | Status: DC | PRN
Start: 2022-11-17 — End: 2022-11-18
  Administered 2022-11-17 – 2022-11-18 (×2): 2 via ORAL
  Filled 2022-11-17 (×2): qty 2

## 2022-11-17 MED ORDER — KETOROLAC TROMETHAMINE 30 MG/ML IJ SOLN
INTRAMUSCULAR | Status: AC
  Filled 2022-11-17: qty 1

## 2022-11-17 MED ORDER — ONDANSETRON HCL 4 MG/2ML IJ SOLN: 4.0000 mg | Freq: Four times a day (QID) | INTRAMUSCULAR | Status: DC | PRN

## 2022-11-17 MED ORDER — KCL IN DEXTROSE-NACL 20-5-0.45 MEQ/L-%-% IV SOLN
INTRAVENOUS | Status: DC
  Filled 2022-11-17: qty 1000

## 2022-11-17 MED ORDER — VANCOMYCIN HCL 500 MG IV SOLR
INTRAVENOUS | Status: AC
  Filled 2022-11-17: qty 10

## 2022-11-17 SURGICAL SUPPLY — 76 items
APL PRP STRL LF DISP 70% ISPRP (MISCELLANEOUS) ×2
BAG COUNTER SPONGE SURGICOUNT (BAG) ×1 IMPLANT
BAG SPNG CNTER NS LX DISP (BAG) ×1
BANDAGE ESMARK 6X9 LF (GAUZE/BANDAGES/DRESSINGS) ×1 IMPLANT
BIT DRILL 2.4 AO COUPLING CANN (BIT) IMPLANT
BIT DRILL LONG 2.5 (BIT) IMPLANT
BLADE SURG 10 STRL SS (BLADE) ×1 IMPLANT
BNDG ADH 5X4 AIR PERM ELC (GAUZE/BANDAGES/DRESSINGS) ×1
BNDG CMPR 5X6 CHSV STRCH STRL (GAUZE/BANDAGES/DRESSINGS)
BNDG CMPR 6 X 5 YARDS HK CLSR (GAUZE/BANDAGES/DRESSINGS) ×1
BNDG CMPR 9X6 STRL LF SNTH (GAUZE/BANDAGES/DRESSINGS)
BNDG CMPR STD VLCR NS LF 5.8X4 (GAUZE/BANDAGES/DRESSINGS) ×1
BNDG COHESIVE 4X5 TAN STRL (GAUZE/BANDAGES/DRESSINGS) ×1 IMPLANT
BNDG COHESIVE 4X5 WHT NS (GAUZE/BANDAGES/DRESSINGS) IMPLANT
BNDG COHESIVE 6X5 TAN ST LF (GAUZE/BANDAGES/DRESSINGS) ×1 IMPLANT
BNDG ELASTIC 4X5.8 VLCR NS LF (GAUZE/BANDAGES/DRESSINGS) IMPLANT
BNDG ELASTIC 6INX 5YD STR LF (GAUZE/BANDAGES/DRESSINGS) IMPLANT
BNDG ESMARK 6X9 LF (GAUZE/BANDAGES/DRESSINGS)
CHLORAPREP W/TINT 26 (MISCELLANEOUS) ×1 IMPLANT
COVER SURGICAL LIGHT HANDLE (MISCELLANEOUS) ×1 IMPLANT
CUFF TOURN SGL QUICK 34 (TOURNIQUET CUFF) ×1
CUFF TRNQT CYL 34X4.125X (TOURNIQUET CUFF) ×1 IMPLANT
DRAPE C-ARM 42X72 X-RAY (DRAPES) IMPLANT
DRAPE C-ARMOR (DRAPES) IMPLANT
DRAPE U-SHAPE 47X51 STRL (DRAPES) ×1 IMPLANT
DRILL LONG 2.5 (BIT) ×1
DRSG ADAPTIC 3X8 NADH LF (GAUZE/BANDAGES/DRESSINGS) IMPLANT
DRSG MEPITEL 4X7.2 (GAUZE/BANDAGES/DRESSINGS) IMPLANT
ELECT REM PT RETURN 9FT ADLT (ELECTROSURGICAL) ×1
ELECTRODE REM PT RTRN 9FT ADLT (ELECTROSURGICAL) ×1 IMPLANT
GAUZE PAD ABD 8X10 STRL (GAUZE/BANDAGES/DRESSINGS) ×2 IMPLANT
GAUZE SPONGE 4X4 12PLY STRL (GAUZE/BANDAGES/DRESSINGS) IMPLANT
GLOVE BIO SURGEON STRL SZ8 (GLOVE) ×1 IMPLANT
GLOVE BIOGEL PI IND STRL 8 (GLOVE) ×2 IMPLANT
GOWN STRL REUS W/ TWL LRG LVL3 (GOWN DISPOSABLE) ×1 IMPLANT
GOWN STRL REUS W/ TWL XL LVL3 (GOWN DISPOSABLE) ×2 IMPLANT
GOWN STRL REUS W/TWL LRG LVL3 (GOWN DISPOSABLE) ×1
GOWN STRL REUS W/TWL XL LVL3 (GOWN DISPOSABLE) ×1
K-WIRE ALPS MXV 1.6X6 ZI (WIRE) ×2
K-WIRE TROC 1.25X150 (WIRE) ×1
KIT BASIN OR (CUSTOM PROCEDURE TRAY) ×1 IMPLANT
KIT TURNOVER KIT B (KITS) ×1 IMPLANT
KWIRE ALPS MXV 1.6X6 ZI (WIRE) IMPLANT
KWIRE TROC 1.25X150 (WIRE) IMPLANT
NS IRRIG 1000ML POUR BTL (IV SOLUTION) ×1 IMPLANT
PACK ORTHO EXTREMITY (CUSTOM PROCEDURE TRAY) ×1 IMPLANT
PAD ABD 8X10 STRL (GAUZE/BANDAGES/DRESSINGS) IMPLANT
PAD ARMBOARD 7.5X6 YLW CONV (MISCELLANEOUS) ×2 IMPLANT
PAD CAST 4YDX4 CTTN HI CHSV (CAST SUPPLIES) ×2 IMPLANT
PADDING CAST ABS COTTON 4X4 ST (CAST SUPPLIES) IMPLANT
PADDING CAST ABS COTTON 6X4 NS (CAST SUPPLIES) IMPLANT
PADDING CAST COTTON 4X4 STRL (CAST SUPPLIES)
PLATE 1/3 TUBULAR 6H (Plate) IMPLANT
PLATE ANTL TIB NRW 10H RT (Plate) IMPLANT
SCREW CANN 4.0X38MM (Screw) ×1 IMPLANT
SCREW CANN PT 4X38 NS (Screw) IMPLANT
SCREW LOCK MDS 3.5X36 (Screw) IMPLANT
SCREW NLOCK 3.5X22 (Screw) IMPLANT
SCREW NLOCK 3.5X24 (Screw) IMPLANT
SCREW NLOCK 3.5X34 (Screw) IMPLANT
SCREW NLOCK ALPS 3.5X14 (Screw) IMPLANT
SCREW NON-LOCK 3.5X10 (Screw) IMPLANT
SCREW NON-LOCK 3.5X12 (Screw) IMPLANT
SOAP 2 % CHG 4 OZ (WOUND CARE) ×1 IMPLANT
SPLINT PLASTER CAST FAST 5X30 (CAST SUPPLIES) IMPLANT
STOCKINETTE IMPERVIOUS 9X36 MD (GAUZE/BANDAGES/DRESSINGS) ×1 IMPLANT
SUCTION TUBE FRAZIER 10FR DISP (SUCTIONS) ×1 IMPLANT
SUT ETHILON 3 0 FSL (SUTURE) IMPLANT
SUT VIC AB 0 CT1 27 (SUTURE) ×1
SUT VIC AB 0 CT1 27XBRD ANBCTR (SUTURE) IMPLANT
SUT VIC AB 2-0 CT1 27 (SUTURE) ×2
SUT VIC AB 2-0 CT1 TAPERPNT 27 (SUTURE) ×2 IMPLANT
SUT VIC AB CT1 27XBRD ANBCTRL (SUTURE) ×1
TOWEL GREEN STERILE (TOWEL DISPOSABLE) ×1 IMPLANT
TUBE CONNECTING 12X1/4 (SUCTIONS) ×1 IMPLANT
WATER STERILE IRR 1000ML POUR (IV SOLUTION) ×1 IMPLANT

## 2022-11-17 NOTE — Transfer of Care (Signed)
Immediate Anesthesia Transfer of Care Note  Patient: Kimberly Parks  Procedure(s) Performed: OPEN REDUCTION INTERNAL FIXATION (ORIF) TIBIA  PILON FRACTURE (Right)  Patient Location: PACU  Anesthesia Type:GA combined with regional for post-op pain  Level of Consciousness: awake and oriented  Airway & Oxygen Therapy: Patient Spontanous Breathing and Patient connected to nasal cannula oxygen  Post-op Assessment: Report given to RN, Post -op Vital signs reviewed and stable, and Patient moving all extremities  Post vital signs: Reviewed and stable  Last Vitals:  Vitals Value Taken Time  BP 128/65 11/17/22 1400  Temp 36.9 C 11/17/22 1400  Pulse 91 11/17/22 1409  Resp 15 11/17/22 1409  SpO2 100 % 11/17/22 1409  Vitals shown include unfiled device data.  Last Pain:  Vitals:   11/17/22 1033  TempSrc: Oral  PainSc:          Complications: There were no known notable events for this encounter.

## 2022-11-17 NOTE — ED Notes (Signed)
Ladona Ridgel RN, charge at [facility] was given report on ED to ED transfer.

## 2022-11-17 NOTE — Plan of Care (Signed)
  Problem: Clinical Measurements: Goal: Diagnostic test results will improve Outcome: Progressing Goal: Cardiovascular complication will be avoided Outcome: Progressing   Problem: Elimination: Goal: Will not experience complications related to bowel motility Outcome: Progressing

## 2022-11-17 NOTE — ED Notes (Addendum)
Report given to Lynden Ang, Charity fundraiser at [facility] PACU. [facility] called to take patient to Pre-Op.

## 2022-11-17 NOTE — Anesthesia Procedure Notes (Addendum)
Procedure Name: LMA Insertion Date/Time: 11/17/2022 11:40 AM  Performed by: Flonnie Hailstone, CRNAPre-anesthesia Checklist: Patient identified, Emergency Drugs available, Suction available and Patient being monitored Patient Re-evaluated:Patient Re-evaluated prior to induction Oxygen Delivery Method: Circle System Utilized Preoxygenation: Pre-oxygenation with 100% oxygen Induction Type: IV induction Ventilation: Mask ventilation without difficulty LMA: LMA inserted LMA Size: 4.0 Number of attempts: 1 Airway Equipment and Method: Bite block Placement Confirmation: positive ETCO2 Tube secured with: Tape Dental Injury: Teeth and Oropharynx as per pre-operative assessment

## 2022-11-17 NOTE — Progress Notes (Signed)
Labs not done at [facility]. Notfied Dr. Maple Hudson, stated labs were not needed.

## 2022-11-17 NOTE — Progress Notes (Signed)
S:  37 y/o female with PMH of ATIII deficiency fell roller skating yesterday and injured her R leg.  She was seen in the ER at [facility] and was found to have a distal tibia displaced intra articular fracture and a comminuted fracture of the fibula.  She was transferred to [facility] for further evaluation and treatment.  She c/o aching pain in the leg.  It's worse with motion and better with rest.  No h/o previous injury to the right leg or ankle.  O:  wn wd woman in nad.  A and O.  EOMI.  Resp unlabored.  R LE splinted.  Skin healthy and intact at the forefoot.  No lymphadenopathy at the knee.  Active PF and DF of the toes.  Intact sens to LT at the forefoot.  Xrays and CT reviewed and show a displaced tibial pilon fracture and a displaced fibular shaft fracture with possible syndesmosis disruption.  A:  R tibial pilon and fibula fractures  P:  to the OR today for ORIF of the fibula and ORIF v. Ex fix of the tibia fractures.    The risks and benefits of the alternative treatment options have been discussed in detail.  The patient wishes to proceed with surgery and specifically understands risks of bleeding, infection, nerve damage, blood clots, need for additional surgery, amputation and death.

## 2022-11-17 NOTE — ED Provider Notes (Signed)
Still awaiting bed request.  Will try to send over to [facility] ED to ED at ED to the OR.   Virgina Norfolk, DO 11/17/22 [phone]

## 2022-11-17 NOTE — ED Notes (Signed)
Attending/surgeon has called and not happy that this patient hasn't made it over to [facility] today   it was explained on my end that we didn't have a bed assignment or any further info for transport of patient   he said he would see what could be done on his end and that I would message our EDP here as well

## 2022-11-17 NOTE — Anesthesia Procedure Notes (Signed)
Anesthesia Regional Block: Adductor canal block   Pre-Anesthetic Checklist: , timeout performed,  Correct Patient, Correct Site, Correct Laterality,  Correct Procedure, Correct Position, site marked,  Risks and benefits discussed,  Surgical consent,  Pre-op evaluation,  At surgeon's request and post-op pain management  Laterality: Right and Lower  Prep: chloraprep       Needles:  Injection technique: Single-shot      Needle Length: 9cm  Needle Gauge: 22     Additional Needles: Arrow StimuQuik ECHO Echogenic Stimulating PNB Needle  Procedures:,,,, ultrasound used (permanent image in chart),,    Narrative:  Start time: 11/17/2022 11:11 AM End time: 11/17/2022 11:17 AM Injection made incrementally with aspirations every 5 mL.  Performed by: Personally  Anesthesiologist: Val Eagle, MD

## 2022-11-17 NOTE — Anesthesia Procedure Notes (Signed)
Anesthesia Regional Block: Popliteal block   Pre-Anesthetic Checklist: , timeout performed,  Correct Patient, Correct Site, Correct Laterality,  Correct Procedure, Correct Position, site marked,  Risks and benefits discussed,  Surgical consent,  Pre-op evaluation,  At surgeon's request and post-op pain management  Laterality: Right and Lower  Prep: chloraprep       Needles:  Injection technique: Single-shot      Needle Length: 9cm  Needle Gauge: 22     Additional Needles: Arrow StimuQuik ECHO Echogenic Stimulating PNB Needle  Procedures:,,,, ultrasound used (permanent image in chart),,    Narrative:  Start time: 11/17/2022 11:18 AM End time: 11/17/2022 11:24 AM Injection made incrementally with aspirations every 5 mL.  Performed by: Personally  Anesthesiologist: Val Eagle, MD

## 2022-11-17 NOTE — Anesthesia Preprocedure Evaluation (Signed)
Anesthesia Evaluation  Patient identified by MRN, date of birth, ID band Patient awake    Reviewed: Allergy & Precautions, H&P , NPO status , Patient's Chart, lab work & pertinent test results  History of Anesthesia Complications Negative for: history of anesthetic complications  Airway Mallampati: II  TM Distance: >3 FB Neck ROM: Full    Dental  (+) Teeth Intact, Dental Advisory Given   Pulmonary neg pulmonary ROS   breath sounds clear to auscultation       Cardiovascular negative cardio ROS  Rhythm:Regular     Neuro/Psych  PSYCHIATRIC DISORDERS Anxiety     negative neurological ROS     GI/Hepatic negative GI ROS, Neg liver ROS,,,  Endo/Other  negative endocrine ROS    Renal/GU negative Renal ROS     Musculoskeletal Right tibia fx   Abdominal   Peds  Hematology negative hematology ROS (+)   Anesthesia Other Findings   Reproductive/Obstetrics Lab Results      Component                Value               Date                      PREGTESTUR               NEGATIVE            11/17/2022                HCG                      <5.0                05/03/2018                                        Anesthesia Physical Anesthesia Plan  ASA: 1  Anesthesia Plan: General and Regional   Post-op Pain Management: Regional block*   Induction: Intravenous  PONV Risk Score and Plan: 3 and Ondansetron and Dexamethasone  Airway Management Planned: LMA  Additional Equipment: None  Intra-op Plan:   Post-operative Plan: Extubation in OR  Informed Consent: I have reviewed the patients History and Physical, chart, labs and discussed the procedure including the risks, benefits and alternatives for the proposed anesthesia with the patient or authorized representative who has indicated his/her understanding and acceptance.     Dental advisory given  Plan Discussed with: CRNA  Anesthesia Plan  Comments:        Anesthesia Quick Evaluation
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
## 2022-11-17 NOTE — Anesthesia Postprocedure Evaluation (Addendum)
Anesthesia Post Note  Patient: Kimberly Parks  Procedure(s) Performed: OPEN REDUCTION INTERNAL FIXATION (ORIF) TIBIA  PILON FRACTURE (Right)     Patient location during evaluation: PACU Anesthesia Type: General and Regional Level of consciousness: awake and alert Pain management: pain level controlled Vital Signs Assessment: post-procedure vital signs reviewed and stable Respiratory status: spontaneous breathing, nonlabored ventilation and respiratory function stable Cardiovascular status: blood pressure returned to baseline and stable Postop Assessment: no apparent nausea or vomiting Anesthetic complications: no   There were no known notable events for this encounter.  Last Vitals:  Vitals:   11/17/22 1434 11/17/22 1629  BP: 115/67 115/68  Pulse: 80 93  Resp: 18 18  Temp: 36.6 C 37.1 C  SpO2: 97% 97%    Last Pain:  Vitals:   11/17/22 1629  TempSrc: Oral  PainSc:                  Maire Govan

## 2022-11-18 LAB — BASIC METABOLIC PANEL
Anion gap: 8 (ref 5–15)
BUN: 11 mg/dL (ref 6–20)
CO2: 23 mmol/L (ref 22–32)
Calcium: 9.2 mg/dL (ref 8.9–10.3)
Chloride: 106 mmol/L (ref 98–111)
Creatinine, Ser: 0.57 mg/dL (ref 0.44–1.00)
GFR, Estimated: >60 mL/min (ref >60)
Glucose, Bld: 114 mg/dL — ABNORMAL HIGH (ref 70–99)
Potassium: 4.2 mmol/L (ref 3.5–5.1)
Sodium: 137 mmol/L (ref 135–145)

## 2022-11-18 MED ORDER — ENOXAPARIN SODIUM 40 MG/0.4ML IJ SOSY: 40.0000 mg | PREFILLED_SYRINGE | INTRAMUSCULAR | 0 refills | Status: DC

## 2022-11-18 MED ORDER — SENNA 8.6 MG PO TABS: 2.0000 | ORAL_TABLET | Freq: Two times a day (BID) | ORAL | 0 refills | Status: AC

## 2022-11-18 MED ORDER — OXYCODONE HCL 5 MG PO TABS: 5.0000 mg | ORAL_TABLET | ORAL | 0 refills | Status: AC | PRN

## 2022-11-18 MED ORDER — DOCUSATE SODIUM 100 MG PO CAPS: 100.0000 mg | ORAL_CAPSULE | Freq: Two times a day (BID) | ORAL | 0 refills | Status: AC

## 2022-11-18 MED ORDER — ENOXAPARIN SODIUM 40 MG/0.4ML IJ SOSY: 40.0000 mg | PREFILLED_SYRINGE | INTRAMUSCULAR | 0 refills | Status: AC

## 2022-11-18 NOTE — Progress Notes (Deleted)
Written prescription was given for Lovenox, provider made aware that prescription may need signature and provider said no signature required will give prescription to patient.

## 2022-11-18 NOTE — Discharge Instructions (Signed)
Toni Arthurs, MD [facility]  Please read the following information regarding your care after surgery.  Medications  You only need a prescription for the narcotic pain medicine (ex. oxycodone, Percocet, Norco).  All of the other medicines listed below are available over the counter. ? Aleve 2 pills twice a day for the first 3 days after surgery. ? acetominophen (Tylenol) 650 mg every 4-6 hours as you need for minor to moderate pain ? oxycodone as prescribed for severe pain  Narcotic pain medicine (ex. oxycodone, Percocet, Vicodin) will cause constipation.  To prevent this problem, take the following medicines while you are taking any pain medicine. ? docusate sodium (Colace) 100 mg twice a day ? senna (Senokot) 2 tablets twice a day  ? To help prevent blood clots, take lovenox as prescribed for two weeks after surgery.  You should also get up every hour while you are awake to move around.    Weight Bearing ? Do not bear any weight on the operated leg or foot.  Cast / Splint / Dressing ? Keep your splint, cast or dressing clean and dry.  Don't put anything (coat hanger, pencil, etc) down inside of it.  If it gets damp, use a hair dryer on the cool setting to dry it.  If it gets soaked, call the office to schedule an appointment for a cast change.   After your dressing, cast or splint is removed; you may shower, but do not soak or scrub the wound.  Allow the water to run over it, and then gently pat it dry.  Swelling It is normal for you to have swelling where you had surgery.  To reduce swelling and pain, keep your toes above your nose for at least 3 days after surgery.  It may be necessary to keep your foot or leg elevated for several weeks.  If it hurts, it should be elevated.  Follow Up Call my office at [phone] when you are discharged from the hospital or surgery center to schedule an appointment to be seen two weeks after surgery.  Call my office at [phone] if you develop  a fever >101.5 F, nausea, vomiting, bleeding from the surgical site or severe pain.

## 2022-11-18 NOTE — TOC Transition Note (Addendum)
Transition of Care [facility]) - CM/SW Discharge Note   Patient Details  Name: Kimberly Parks MRN: [id number] Date of Birth: 1985/10/22  Transition of Care [facility]) CM/SW Contact:  Epifanio Lesches, RN Phone Number: 11/18/2022, 10:19 AM   Clinical Narrative:    Patient will DC to: home Anticipated DC date: 11/18/2022 Family notified: yes Transport by: car      - s/p  R TIBIA PILON FRACTURE , 11/10 Per MD patient ready for DC today. RN, patient, and patient's husband notified of DC. Friend to assist with care once d/c.   Referral made with [employer] for RW. Equipment will be delivered to bedside prior to d/c . Pt without RX med concerns and has transportation ( husband) to home. Post hospital f/u noted on AVS.  RNCM will sign off for now as intervention is no longer needed. Please consult [place] again if new needs arise.    Final next level of care: Home/Self Care Barriers to Discharge: No Barriers Identified   Patient Goals and CMS Choice   Choice offered to / list presented to : Patient  Discharge Placement                         Discharge Plan and Services Additional resources added to the After Visit Summary for                  DME Arranged: Walker rolling DME Agency: [employer] Date DME Agency Contacted: 11/18/22 Time DME Agency Contacted: [phone] Representative spoke with at DME Agency: Ian Malkin            Social Determinants of Health (SDOH) Interventions SDOH Screenings   Food Insecurity: No Food Insecurity (11/17/2022)  Housing: Low Risk  (11/17/2022)  Transportation Needs: No Transportation Needs (11/17/2022)  Utilities: Not At Risk (11/17/2022)  Depression (PHQ2-9): Low Risk  (03/18/2019)  Financial Resource Strain: Low Risk  (11/21/2018)  Tobacco Use: Low Risk  (11/17/2022)     Readmission Risk Interventions     No data to display

## 2022-11-18 NOTE — Discharge Planning (Signed)
Discharge packet reviewed with patient at bedside, IV access removed, patient discharged with family member via private transportation.

## 2022-11-18 NOTE — Progress Notes (Signed)
Subjective: 1 Day Post-Op Procedure(s) (LRB): OPEN REDUCTION INTERNAL FIXATION (ORIF) TIBIA  PILON FRACTURE (Right)  Patient reports pain as mild.  Notes that foot is mostly numb due to block.  Denies fever, chills, N/V, CP, SOB.  Tolerating POs well. Reports that she would like to go home.  Objective:   VITALS:  Temp:  [97.9 F (36.6 C)-98.9 F (37.2 C)] 98.1 F (36.7 C) (11/11 0736) Pulse Rate:  [62-96] 65 (11/11 0736) Resp:  [11-18] 16 (11/11 0439) BP: (106-130)/(52-94) 110/66 (11/11 0736) SpO2:  [95 %-100 %] 100 % (11/11 0736) Weight:  [79.4 kg] 79.4 kg (11/10 1034)  General: WDWN patient in NAD. Psych:  Appropriate mood and affect. Neuro:  A&O x 3, Moving all extremities, sensation subjectively diminished to light touch HEENT:  EOMs intact Chest:  Even non-labored respirations Skin:  SLS C/D/I, no rashes or lesions Extremities: warm/dry, no visible edema, erythema or echymosis.  No lymphadenopathy. Pulses: Popliteus 2+ MSK:  ROM: TKE, MMT: able to perform quad set    LABS Recent Labs    11/17/22 1510  HGB 12.0  WBC 10.6*  PLT 220   Recent Labs    11/17/22 1510  CREATININE 0.74   No results for input(s): "LABPT", "INR" in the last 72 hours.   Assessment/Plan: 1 Day Post-Op Procedure(s) (LRB): OPEN REDUCTION INTERNAL FIXATION (ORIF) TIBIA  PILON FRACTURE (Right)  NWB R LE Up with therapy Disp:  D/C home after working with therapy. DVT ppx:  lovenox D/C scripts for oxycodone and lovenox sent to outpatient pharmacy after searching [place] PMP Aware Plan for 2 week outpatient post-op visit.  Alfredo Martinez PA-C [facility] Office:  [phone]
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
## 2022-11-19 ENCOUNTER — Encounter (HOSPITAL_COMMUNITY): Payer: Self-pay | Admitting: Orthopedic Surgery
